# Patient Record
Sex: Female | Born: 1970 | Race: Black or African American | Hispanic: No | Marital: Single | Smoking: Current every day smoker
Health system: Southern US, Community
[De-identification: ages and names within clinical notes are randomized; demographics above are authoritative.]

## PROBLEM LIST (undated history)

## (undated) DIAGNOSIS — D259 Leiomyoma of uterus, unspecified: Secondary | ICD-10-CM

## (undated) DIAGNOSIS — IMO0002 Reserved for concepts with insufficient information to code with codable children: Secondary | ICD-10-CM

## (undated) DIAGNOSIS — D649 Anemia, unspecified: Secondary | ICD-10-CM

## (undated) DIAGNOSIS — J45909 Unspecified asthma, uncomplicated: Secondary | ICD-10-CM

## (undated) DIAGNOSIS — R87619 Unspecified abnormal cytological findings in specimens from cervix uteri: Secondary | ICD-10-CM

## (undated) HISTORY — PX: TUBAL LIGATION: SHX77

---

## 1997-12-10 ENCOUNTER — Emergency Department (HOSPITAL_COMMUNITY): Admission: EM | Admit: 1997-12-10 | Discharge: 1997-12-10 | Payer: Self-pay | Admitting: Emergency Medicine

## 1998-03-25 ENCOUNTER — Emergency Department (HOSPITAL_COMMUNITY): Admission: EM | Admit: 1998-03-25 | Discharge: 1998-03-25 | Payer: Self-pay | Admitting: Emergency Medicine

## 1998-10-15 ENCOUNTER — Emergency Department (HOSPITAL_COMMUNITY): Admission: EM | Admit: 1998-10-15 | Discharge: 1998-10-15 | Payer: Self-pay | Admitting: Emergency Medicine

## 1998-11-06 ENCOUNTER — Inpatient Hospital Stay (HOSPITAL_COMMUNITY): Admission: AD | Admit: 1998-11-06 | Discharge: 1998-11-06 | Payer: Self-pay | Admitting: *Deleted

## 1998-11-08 ENCOUNTER — Emergency Department (HOSPITAL_COMMUNITY): Admission: EM | Admit: 1998-11-08 | Discharge: 1998-11-08 | Payer: Self-pay | Admitting: *Deleted

## 1999-11-18 ENCOUNTER — Inpatient Hospital Stay (HOSPITAL_COMMUNITY): Admission: AD | Admit: 1999-11-18 | Discharge: 1999-11-18 | Payer: Self-pay | Admitting: Obstetrics

## 2000-04-03 ENCOUNTER — Emergency Department (HOSPITAL_COMMUNITY): Admission: EM | Admit: 2000-04-03 | Discharge: 2000-04-03 | Payer: Self-pay | Admitting: Emergency Medicine

## 2000-09-20 ENCOUNTER — Inpatient Hospital Stay (HOSPITAL_COMMUNITY): Admission: AD | Admit: 2000-09-20 | Discharge: 2000-09-20 | Payer: Self-pay | Admitting: *Deleted

## 2000-11-13 ENCOUNTER — Emergency Department (HOSPITAL_COMMUNITY): Admission: EM | Admit: 2000-11-13 | Discharge: 2000-11-13 | Payer: Self-pay | Admitting: Emergency Medicine

## 2001-02-06 ENCOUNTER — Emergency Department (HOSPITAL_COMMUNITY): Admission: EM | Admit: 2001-02-06 | Discharge: 2001-02-07 | Payer: Self-pay | Admitting: Emergency Medicine

## 2001-02-07 ENCOUNTER — Encounter: Payer: Self-pay | Admitting: Emergency Medicine

## 2001-04-21 ENCOUNTER — Emergency Department (HOSPITAL_COMMUNITY): Admission: EM | Admit: 2001-04-21 | Discharge: 2001-04-21 | Payer: Self-pay | Admitting: Emergency Medicine

## 2001-06-26 ENCOUNTER — Emergency Department (HOSPITAL_COMMUNITY): Admission: EM | Admit: 2001-06-26 | Discharge: 2001-06-27 | Payer: Self-pay | Admitting: Emergency Medicine

## 2001-11-16 ENCOUNTER — Other Ambulatory Visit: Admission: RE | Admit: 2001-11-16 | Discharge: 2001-11-16 | Payer: Self-pay | Admitting: Obstetrics and Gynecology

## 2002-11-27 ENCOUNTER — Other Ambulatory Visit: Admission: RE | Admit: 2002-11-27 | Discharge: 2002-11-27 | Payer: Self-pay | Admitting: Obstetrics and Gynecology

## 2004-03-05 ENCOUNTER — Emergency Department (HOSPITAL_COMMUNITY): Admission: EM | Admit: 2004-03-05 | Discharge: 2004-03-05 | Payer: Self-pay | Admitting: Emergency Medicine

## 2004-07-11 ENCOUNTER — Emergency Department (HOSPITAL_COMMUNITY): Admission: EM | Admit: 2004-07-11 | Discharge: 2004-07-11 | Payer: Self-pay | Admitting: Emergency Medicine

## 2004-11-17 ENCOUNTER — Emergency Department (HOSPITAL_COMMUNITY): Admission: EM | Admit: 2004-11-17 | Discharge: 2004-11-17 | Payer: Self-pay | Admitting: Emergency Medicine

## 2005-05-18 ENCOUNTER — Emergency Department (HOSPITAL_COMMUNITY): Admission: EM | Admit: 2005-05-18 | Discharge: 2005-05-18 | Payer: Self-pay | Admitting: Emergency Medicine

## 2006-03-20 ENCOUNTER — Emergency Department (HOSPITAL_COMMUNITY): Admission: EM | Admit: 2006-03-20 | Discharge: 2006-03-20 | Payer: Self-pay | Admitting: Emergency Medicine

## 2006-08-19 ENCOUNTER — Emergency Department (HOSPITAL_COMMUNITY): Admission: EM | Admit: 2006-08-19 | Discharge: 2006-08-19 | Payer: Self-pay | Admitting: Family Medicine

## 2008-02-20 DIAGNOSIS — R002 Palpitations: Secondary | ICD-10-CM | POA: Insufficient documentation

## 2008-02-20 DIAGNOSIS — R5381 Other malaise: Secondary | ICD-10-CM | POA: Insufficient documentation

## 2008-02-20 DIAGNOSIS — I5023 Acute on chronic systolic (congestive) heart failure: Secondary | ICD-10-CM | POA: Insufficient documentation

## 2008-02-20 DIAGNOSIS — R5383 Other fatigue: Secondary | ICD-10-CM

## 2008-02-20 DIAGNOSIS — R031 Nonspecific low blood-pressure reading: Secondary | ICD-10-CM

## 2008-02-20 DIAGNOSIS — R9389 Abnormal findings on diagnostic imaging of other specified body structures: Secondary | ICD-10-CM | POA: Insufficient documentation

## 2008-02-20 DIAGNOSIS — R209 Unspecified disturbances of skin sensation: Secondary | ICD-10-CM | POA: Insufficient documentation

## 2008-02-20 DIAGNOSIS — R0602 Shortness of breath: Secondary | ICD-10-CM | POA: Insufficient documentation

## 2008-02-20 DIAGNOSIS — R0989 Other specified symptoms and signs involving the circulatory and respiratory systems: Secondary | ICD-10-CM | POA: Insufficient documentation

## 2008-04-05 ENCOUNTER — Emergency Department (HOSPITAL_COMMUNITY): Admission: EM | Admit: 2008-04-05 | Discharge: 2008-04-05 | Payer: Self-pay | Admitting: Family Medicine

## 2008-04-06 ENCOUNTER — Emergency Department (HOSPITAL_COMMUNITY): Admission: EM | Admit: 2008-04-06 | Discharge: 2008-04-06 | Payer: Self-pay | Admitting: Family Medicine

## 2008-05-19 ENCOUNTER — Emergency Department (HOSPITAL_COMMUNITY): Admission: EM | Admit: 2008-05-19 | Discharge: 2008-05-19 | Payer: Self-pay | Admitting: Emergency Medicine

## 2008-07-17 ENCOUNTER — Emergency Department (HOSPITAL_COMMUNITY): Admission: EM | Admit: 2008-07-17 | Discharge: 2008-07-17 | Payer: Self-pay | Admitting: Family Medicine

## 2008-08-01 ENCOUNTER — Encounter (INDEPENDENT_AMBULATORY_CARE_PROVIDER_SITE_OTHER): Payer: Self-pay | Admitting: *Deleted

## 2008-08-01 DIAGNOSIS — F329 Major depressive disorder, single episode, unspecified: Secondary | ICD-10-CM

## 2008-08-08 ENCOUNTER — Ambulatory Visit: Payer: Self-pay | Admitting: Family Medicine

## 2008-08-13 ENCOUNTER — Encounter: Payer: Self-pay | Admitting: Family Medicine

## 2008-08-13 ENCOUNTER — Ambulatory Visit: Payer: Self-pay | Admitting: Family Medicine

## 2008-08-13 ENCOUNTER — Telehealth (INDEPENDENT_AMBULATORY_CARE_PROVIDER_SITE_OTHER): Payer: Self-pay | Admitting: *Deleted

## 2008-08-15 ENCOUNTER — Encounter: Admission: RE | Admit: 2008-08-15 | Discharge: 2008-08-15 | Payer: Self-pay | Admitting: Family Medicine

## 2008-08-15 ENCOUNTER — Ambulatory Visit: Payer: Self-pay | Admitting: Family Medicine

## 2008-08-22 ENCOUNTER — Ambulatory Visit: Payer: Self-pay | Admitting: Family Medicine

## 2008-08-28 ENCOUNTER — Telehealth: Payer: Self-pay | Admitting: Psychology

## 2008-09-02 ENCOUNTER — Encounter: Payer: Self-pay | Admitting: Family Medicine

## 2008-09-02 ENCOUNTER — Other Ambulatory Visit: Admission: RE | Admit: 2008-09-02 | Discharge: 2008-09-02 | Payer: Self-pay | Admitting: Family Medicine

## 2008-09-02 ENCOUNTER — Telehealth: Payer: Self-pay | Admitting: Family Medicine

## 2008-09-02 ENCOUNTER — Ambulatory Visit: Payer: Self-pay | Admitting: Family Medicine

## 2008-09-02 DIAGNOSIS — N898 Other specified noninflammatory disorders of vagina: Secondary | ICD-10-CM | POA: Insufficient documentation

## 2008-09-02 LAB — CONVERTED CEMR LAB
Blood in Urine, dipstick: NEGATIVE
Nitrite: NEGATIVE
Protein, U semiquant: 30
Urobilinogen, UA: 0.2

## 2008-09-04 LAB — CONVERTED CEMR LAB
Chlamydia, DNA Probe: NEGATIVE
GC Probe Amp, Genital: NEGATIVE

## 2008-09-27 ENCOUNTER — Telehealth: Payer: Self-pay | Admitting: Family Medicine

## 2008-10-01 ENCOUNTER — Telehealth: Payer: Self-pay | Admitting: *Deleted

## 2008-10-07 ENCOUNTER — Telehealth: Payer: Self-pay | Admitting: Family Medicine

## 2008-10-08 ENCOUNTER — Ambulatory Visit: Payer: Self-pay | Admitting: Family Medicine

## 2008-10-08 LAB — CONVERTED CEMR LAB: Whiff Test: POSITIVE

## 2008-10-10 ENCOUNTER — Telehealth (INDEPENDENT_AMBULATORY_CARE_PROVIDER_SITE_OTHER): Payer: Self-pay | Admitting: *Deleted

## 2008-11-01 ENCOUNTER — Ambulatory Visit: Payer: Self-pay | Admitting: Family Medicine

## 2009-03-05 ENCOUNTER — Ambulatory Visit: Payer: Self-pay | Admitting: Family Medicine

## 2009-03-05 DIAGNOSIS — E669 Obesity, unspecified: Secondary | ICD-10-CM

## 2009-03-12 ENCOUNTER — Ambulatory Visit: Payer: Self-pay | Admitting: Family Medicine

## 2009-03-14 ENCOUNTER — Ambulatory Visit: Payer: Self-pay | Admitting: Family Medicine

## 2009-03-14 DIAGNOSIS — J309 Allergic rhinitis, unspecified: Secondary | ICD-10-CM | POA: Insufficient documentation

## 2009-03-20 ENCOUNTER — Ambulatory Visit: Payer: Self-pay | Admitting: Family Medicine

## 2009-03-27 ENCOUNTER — Ambulatory Visit: Payer: Self-pay | Admitting: Sports Medicine

## 2009-03-27 DIAGNOSIS — M6740 Ganglion, unspecified site: Secondary | ICD-10-CM | POA: Insufficient documentation

## 2009-03-31 ENCOUNTER — Encounter (INDEPENDENT_AMBULATORY_CARE_PROVIDER_SITE_OTHER): Payer: Self-pay | Admitting: *Deleted

## 2009-03-31 DIAGNOSIS — F17200 Nicotine dependence, unspecified, uncomplicated: Secondary | ICD-10-CM | POA: Insufficient documentation

## 2009-09-30 ENCOUNTER — Emergency Department (HOSPITAL_COMMUNITY): Admission: EM | Admit: 2009-09-30 | Discharge: 2009-09-30 | Payer: Self-pay | Admitting: Family Medicine

## 2010-01-14 ENCOUNTER — Encounter: Payer: Self-pay | Admitting: Family Medicine

## 2010-03-31 ENCOUNTER — Encounter: Payer: Self-pay | Admitting: Family Medicine

## 2010-04-28 NOTE — Miscellaneous (Signed)
  Clinical Lists Changes  Problems: Removed problem of History of  ASTHMA, CHILDHOOD (ICD-493.00) 

## 2010-04-28 NOTE — Miscellaneous (Signed)
Summary: Tobacco Adele Milson  Clinical Lists Changes  Problems: Added new problem of TOBACCO Audery Wassenaar (ICD-305.1) 

## 2010-04-30 NOTE — Progress Notes (Signed)
Summary: ROI  ROI   Imported By: De Nurse 04/09/2010 16:36:59  _____________________________________________________________________  External Attachment:    Type:   Image     Comment:   External Document

## 2010-06-14 LAB — URINE CULTURE: Colony Count: >=100000

## 2010-06-14 LAB — WET PREP, GENITAL: Trich, Wet Prep: NONE SEEN

## 2010-06-14 LAB — GC/CHLAMYDIA PROBE AMP, GENITAL
Chlamydia, DNA Probe: NEGATIVE
GC Probe Amp, Genital: NEGATIVE

## 2010-06-14 LAB — POCT URINALYSIS DIP (DEVICE)
Ketones, ur: NEGATIVE mg/dL
Protein, ur: NEGATIVE mg/dL
Specific Gravity, Urine: 1.025 (ref 1.005–1.030)

## 2010-07-08 LAB — POCT URINALYSIS DIP (DEVICE)
Glucose, UA: NEGATIVE mg/dL
Specific Gravity, Urine: 1.025 (ref 1.005–1.030)
Urobilinogen, UA: 2 mg/dL — ABNORMAL HIGH (ref 0.0–1.0)

## 2010-07-08 LAB — URINE CULTURE: Colony Count: >=100000

## 2010-07-08 LAB — POCT PREGNANCY, URINE: Preg Test, Ur: NEGATIVE

## 2010-07-08 LAB — WET PREP, GENITAL: Yeast Wet Prep HPF POC: NONE SEEN

## 2010-07-14 LAB — URINALYSIS, ROUTINE W REFLEX MICROSCOPIC
Bilirubin Urine: NEGATIVE
Hgb urine dipstick: NEGATIVE
Ketones, ur: NEGATIVE mg/dL
Nitrite: NEGATIVE
Protein, ur: NEGATIVE mg/dL
Specific Gravity, Urine: 1.03 (ref 1.005–1.030)
Urobilinogen, UA: 1 mg/dL (ref 0.0–1.0)

## 2010-07-14 LAB — POCT I-STAT, CHEM 8
BUN: 14 mg/dL (ref 6–23)
Calcium, Ion: 1.19 mmol/L (ref 1.12–1.32)
Creatinine, Ser: 1 mg/dL (ref 0.4–1.2)
Hemoglobin: 14.6 g/dL (ref 12.0–15.0)
Sodium: 142 mEq/L (ref 135–145)
TCO2: 30 mmol/L (ref 0–100)

## 2010-07-14 LAB — CBC
HCT: 41.5 % (ref 36.0–46.0)
MCHC: 35 g/dL (ref 30.0–36.0)
MCV: 83.1 fL (ref 78.0–100.0)
Platelets: 198 10*3/uL (ref 150–400)

## 2010-07-14 LAB — DIFFERENTIAL
Basophils Relative: 1 % (ref 0–1)
Eosinophils Absolute: 0.2 10*3/uL (ref 0.0–0.7)
Eosinophils Relative: 3 % (ref 0–5)
Lymphs Abs: 2 10*3/uL (ref 0.7–4.0)
Neutrophils Relative %: 52 % (ref 43–77)

## 2010-07-14 LAB — POCT CARDIAC MARKERS
CKMB, poc: <1 ng/mL — ABNORMAL LOW (ref 1.0–8.0)
CKMB, poc: <1 ng/mL — ABNORMAL LOW (ref 1.0–8.0)
Troponin i, poc: <0.05 ng/mL (ref 0.00–0.09)

## 2010-07-14 LAB — D-DIMER, QUANTITATIVE: D-Dimer, Quant: 0.41 ug/mL-FEU (ref 0.00–0.48)

## 2011-01-09 ENCOUNTER — Inpatient Hospital Stay (INDEPENDENT_AMBULATORY_CARE_PROVIDER_SITE_OTHER)
Admission: RE | Admit: 2011-01-09 | Discharge: 2011-01-09 | Disposition: A | Discharge status: Home / Self Care | Payer: Self-pay | Source: Ambulatory Visit | Attending: Family Medicine | Admitting: Family Medicine

## 2011-01-09 DIAGNOSIS — J069 Acute upper respiratory infection, unspecified: Secondary | ICD-10-CM

## 2011-01-09 DIAGNOSIS — H6690 Otitis media, unspecified, unspecified ear: Secondary | ICD-10-CM

## 2011-09-11 ENCOUNTER — Encounter (HOSPITAL_COMMUNITY): Payer: Self-pay

## 2011-09-11 ENCOUNTER — Emergency Department (HOSPITAL_COMMUNITY)
Admission: EM | Admit: 2011-09-11 | Discharge: 2011-09-11 | Disposition: A | Discharge status: Home / Self Care | Payer: Medicaid Other | Attending: Emergency Medicine | Admitting: Emergency Medicine

## 2011-09-11 DIAGNOSIS — R51 Headache: Secondary | ICD-10-CM | POA: Insufficient documentation

## 2011-09-11 DIAGNOSIS — F17200 Nicotine dependence, unspecified, uncomplicated: Secondary | ICD-10-CM | POA: Insufficient documentation

## 2011-09-11 DIAGNOSIS — R110 Nausea: Secondary | ICD-10-CM | POA: Insufficient documentation

## 2011-09-11 MED ORDER — IBUPROFEN 800 MG PO TABS
800.0000 mg | ORAL_TABLET | Freq: Once | ORAL | Status: AC
  Administered 2011-09-11: 800 mg via ORAL
  Filled 2011-09-11: qty 1

## 2011-09-11 MED ORDER — CICLOPIROX 8 % EX SOLN: Freq: Every day | CUTANEOUS | Status: AC

## 2011-09-11 MED ORDER — ONDANSETRON HCL 4 MG PO TABS: 4.0000 mg | ORAL_TABLET | Freq: Four times a day (QID) | ORAL | Status: AC

## 2011-09-11 NOTE — ED Provider Notes (Signed)
Medical screening examination/treatment/procedure(s) were performed by non-physician practitioner and as supervising physician I was immediately available for consultation/collaboration.  Toy Baker, MD 09/11/11 [phone]

## 2011-09-11 NOTE — ED Notes (Signed)
Pt in from home with headache states onset this am states pain on left side of head states pain is pressure in sensation denies vision difficulties denies hx of migraines

## 2011-09-11 NOTE — Discharge Instructions (Signed)
Ms Stailey you have a headache today.  Drink plenty of water and take ibuprofen up to 800mg  every 6 hours with food as needed for pain.  No neurologic or stroke symptoms today.  Follow up with your pcp as needed.  Return for severe pain or uncontrolled nausea and vomiting.    General Headache, Without Cause A general headache has no specific cause. These headaches are not life-threatening. They will not lead to other types of headaches. HOME CARE   Make and keep follow-up visits with your doctor.   Only take medicine as told by your doctor.   Try to relax, get a massage, or use your thoughts to control your body (biofeedback).   Apply cold or heat to the head and neck. Apply 3 or 4 times a day or as needed.  Finding out the results of your test Ask when your test results will be ready. Make sure you get your test results. GET HELP RIGHT AWAY IF:   You have problems with medicine.   Your medicine does not help relieve pain.   Your headache changes or becomes worse.   You feel sick to your stomach (nauseous) or throw up (vomit).   You have a temperature by mouth above 102 F (38.9 C), not controlled by medicine.   Your have a stiff neck.   You have vision loss.   You have muscle weakness.   You lose control of your muscles.   You lose balance or have trouble walking.   You feel like you are going to pass out (faint).  MAKE SURE YOU:   Understand these instructions.   Will watch this condition.   Will get help right away if you are not doing well or get worse.  Document Released: 12/23/2007 Document Revised: 03/04/2011 Document Reviewed: 12/23/2007 [facility] Patient Information 2012 [place], [place].

## 2011-09-11 NOTE — ED Provider Notes (Signed)
History     CSN: [id number]  Arrival date & time 09/11/11  1520   First MD Initiated Contact with Patient 09/11/11 1549      Chief Complaint  Patient presents with  . Headache    (Consider location/radiation/quality/duration/timing/severity/associated sxs/prior treatment) Patient is a 41 y.o. female presenting with headaches. The history is provided by the patient. No language interpreter was used.  Headache  This is a new problem. The current episode started 6 to 12 hours ago. The problem occurs constantly. The problem has not changed since onset.The headache is associated with nothing. The pain is located in the left unilateral region. The quality of the pain is described as throbbing. The pain is at a severity of 5/10. The pain is mild. The pain does not radiate. Associated symptoms include nausea. Pertinent negatives include no fever, no chest pressure, no near-syncope, no palpitations, no shortness of breath and no vomiting. She has tried nothing for the symptoms.   41 year old female coming in with left unilateral headache behind her left eye since she woke this morning. States she feels like she has a hangover. States she was right fingers when she woke up that has resolved. States she was feeling nauseated earlier. Neuro intact. No stroke symptoms. No past medical history.   History reviewed. No pertinent past medical history.  History reviewed. No pertinent past surgical history.  No family history on file.  History  Substance Use Topics  . Smoking status: Current Everyday Smoker  . Smokeless tobacco: Not on file  . Alcohol Use: Yes    OB History    Grav Para Term Preterm Abortions TAB SAB Ect Mult Living                  Review of Systems  Constitutional: Negative.  Negative for fever.  Eyes: Negative.   Respiratory: Negative.  Negative for shortness of breath.   Cardiovascular: Negative.  Negative for palpitations and near-syncope.  Gastrointestinal: Positive  for nausea. Negative for vomiting.  Neurological: Positive for headaches. Negative for dizziness, facial asymmetry, speech difficulty, weakness and numbness.  Psychiatric/Behavioral: Negative.   All other systems reviewed and are negative.    Allergies  Review of patient's allergies indicates no known allergies.  Home Medications   Current Outpatient Rx  Name Route Sig Dispense Refill  . WOMENS DAILY FORMULA PO TABS Oral Take 1 tablet by mouth daily.      BP 107/77  Pulse 88  Temp 98.4 F (36.9 C) (Oral)  Resp 18  Wt 196 lb 3.4 oz (89 kg)  SpO2 97%  LMP 08/28/2011  Physical Exam  Nursing note and vitals reviewed. Constitutional: She is oriented to person, place, and time. She appears well-developed and well-nourished.  HENT:  Head: Normocephalic and atraumatic.  Eyes: Conjunctivae and EOM are normal. Pupils are equal, round, and reactive to light.  Neck: Normal range of motion. Neck supple.  Cardiovascular: Normal rate.   Pulmonary/Chest: Effort normal.  Abdominal: Soft.  Musculoskeletal: Normal range of motion. She exhibits no edema and no tenderness.  Neurological: She is alert and oriented to person, place, and time. She has normal reflexes. No cranial nerve deficit. Coordination normal.  Skin: Skin is warm and dry.  Psychiatric: She has a normal mood and affect.    ED Course  Procedures (including critical care time)  Labs Reviewed - No data to display No results found.   No diagnosis found.    MDM  Headache with awakening this am.  Ibuprofen with relief in ER.  Rx for zofran.  Return for neuro symptoms or severe pain and vomiting.         Remi Haggard, NP 09/11/11 1645

## 2012-02-07 ENCOUNTER — Emergency Department (HOSPITAL_COMMUNITY)
Admission: EM | Admit: 2012-02-07 | Discharge: 2012-02-07 | Disposition: A | Discharge status: Home / Self Care | Payer: Medicaid Other | Attending: Emergency Medicine | Admitting: Emergency Medicine

## 2012-02-07 ENCOUNTER — Encounter (HOSPITAL_COMMUNITY): Payer: Self-pay | Admitting: Emergency Medicine

## 2012-02-07 ENCOUNTER — Emergency Department (HOSPITAL_COMMUNITY): Payer: Medicaid Other

## 2012-02-07 DIAGNOSIS — R110 Nausea: Secondary | ICD-10-CM | POA: Insufficient documentation

## 2012-02-07 DIAGNOSIS — E860 Dehydration: Secondary | ICD-10-CM | POA: Insufficient documentation

## 2012-02-07 DIAGNOSIS — F17200 Nicotine dependence, unspecified, uncomplicated: Secondary | ICD-10-CM | POA: Insufficient documentation

## 2012-02-07 LAB — CBC
HCT: 38.1 % (ref 36.0–46.0)
Hemoglobin: 13.8 g/dL (ref 12.0–15.0)
MCH: 28.3 pg (ref 26.0–34.0)
MCHC: 36.2 g/dL — ABNORMAL HIGH (ref 30.0–36.0)
MCV: 78.2 fL (ref 78.0–100.0)

## 2012-02-07 LAB — URINALYSIS, ROUTINE W REFLEX MICROSCOPIC
Glucose, UA: NEGATIVE mg/dL
Leukocytes, UA: NEGATIVE
Nitrite: NEGATIVE
Protein, ur: NEGATIVE mg/dL
Urobilinogen, UA: 1 mg/dL (ref 0.0–1.0)

## 2012-02-07 LAB — BASIC METABOLIC PANEL
BUN: 14 mg/dL (ref 6–23)
Calcium: 8.7 mg/dL (ref 8.4–10.5)
Creatinine, Ser: 0.88 mg/dL (ref 0.50–1.10)
GFR calc non Af Amer: 82 mL/min — ABNORMAL LOW (ref >90)
Glucose, Bld: 132 mg/dL — ABNORMAL HIGH (ref 70–99)
Sodium: 140 mEq/L (ref 135–145)

## 2012-02-07 LAB — POCT I-STAT TROPONIN I: Troponin i, poc: 0 ng/mL (ref 0.00–0.08)

## 2012-02-07 MED ORDER — SODIUM CHLORIDE 0.9 % IV BOLUS (SEPSIS)
1000.0000 mL | Freq: Once | INTRAVENOUS | Status: AC
  Administered 2012-02-07: 1000 mL via INTRAVENOUS

## 2012-02-07 MED ORDER — SODIUM CHLORIDE 0.9 % IV SOLN
INTRAVENOUS | Status: DC
  Administered 2012-02-07: 13:00:00 via INTRAVENOUS

## 2012-02-07 NOTE — ED Notes (Signed)
Pt presenting to ed with c/o dizziness with positive nausea no vomiting. Pt states chest pain this morning but denies pain at this time. Pt denies shortness of breath at this time.

## 2012-02-07 NOTE — ED Notes (Signed)
Patient transported to CT 

## 2012-02-07 NOTE — ED Provider Notes (Signed)
History     CSN: [id number]  Arrival date & time 02/07/12  1040   First MD Initiated Contact with Patient 02/07/12 1118      Chief Complaint  Patient presents with  . Dizziness    (Consider location/radiation/quality/duration/timing/severity/associated sxs/prior treatment) The history is provided by the patient.   Patient here with dizziness that began yesterday. No associated headache or blurred vision. Symptoms began at rest and gradually resolved. She went to work today and she became lightheaded that was worse with walking. Denies any vomiting. Has had some nausea. Denies any dyspnea. Did have a 1-2 second episode of sharp chest pain abdominal pain which resolved spontaneously. No prior history of same. Patient denies any anginal type symptoms. She doesn't have any urinary symptoms. History reviewed. No pertinent past medical history.  Past Surgical History  Procedure Date  . Cesarean section   . Tubal ligation     No family history on file.  History  Substance Use Topics  . Smoking status: Current Every Day Smoker    Types: Cigarettes  . Smokeless tobacco: Not on file  . Alcohol Use: Yes     Comment: rarely    OB History    Grav Para Term Preterm Abortions TAB SAB Ect Mult Living                  Review of Systems  All other systems reviewed and are negative.    Allergies  Review of patient's allergies indicates no known allergies.  Home Medications   Current Outpatient Rx  Name  Route  Sig  Dispense  Refill  . BC HEADACHE POWDER PO   Oral   Take 1 packet by mouth as needed. Headache         . WOMENS DAILY FORMULA PO TABS   Oral   Take 1 tablet by mouth daily.           BP 119/73  Pulse 87  Temp 98.2 F (36.8 C) (Oral)  Resp 20  SpO2 96%  LMP 01/24/2012  Physical Exam  Nursing note and vitals reviewed. Constitutional: She is oriented to person, place, and time. She appears well-developed and well-nourished.  Non-toxic appearance. No  distress.  HENT:  Head: Normocephalic and atraumatic.  Eyes: Conjunctivae normal, EOM and lids are normal. Pupils are equal, round, and reactive to light.  Neck: Normal range of motion. Neck supple. No tracheal deviation present. No mass present.  Cardiovascular: Normal rate, regular rhythm and normal heart sounds.  Exam reveals no gallop.   No murmur heard. Pulmonary/Chest: Effort normal and breath sounds normal. No stridor. No respiratory distress. She has no decreased breath sounds. She has no wheezes. She has no rhonchi. She has no rales.  Abdominal: Soft. Normal appearance and bowel sounds are normal. She exhibits no distension. There is no tenderness. There is no rebound and no CVA tenderness.  Musculoskeletal: Normal range of motion. She exhibits no edema and no tenderness.  Neurological: She is alert and oriented to person, place, and time. She has normal strength. No cranial nerve deficit or sensory deficit. GCS eye subscore is 4. GCS verbal subscore is 5. GCS motor subscore is 6.  Skin: Skin is warm and dry. No abrasion and no rash noted.  Psychiatric: She has a normal mood and affect. Her speech is normal and behavior is normal.    ED Course  Procedures (including critical care time)   Labs Reviewed  CBC  BASIC METABOLIC PANEL  No results found.   No diagnosis found.    MDM  Pt given iv fluids for suspected hypotension --repeat bp improved, pt feels less dizzy, stable for d/c        Toy Baker, MD 02/07/12 1408

## 2012-02-15 ENCOUNTER — Encounter (HOSPITAL_COMMUNITY): Payer: Self-pay | Admitting: Emergency Medicine

## 2012-02-15 ENCOUNTER — Emergency Department (HOSPITAL_COMMUNITY)
Admission: EM | Admit: 2012-02-15 | Discharge: 2012-02-15 | Disposition: A | Discharge status: Home / Self Care | Payer: Medicaid Other | Attending: Emergency Medicine | Admitting: Emergency Medicine

## 2012-02-15 ENCOUNTER — Emergency Department (HOSPITAL_COMMUNITY): Payer: Medicaid Other

## 2012-02-15 DIAGNOSIS — F17200 Nicotine dependence, unspecified, uncomplicated: Secondary | ICD-10-CM | POA: Insufficient documentation

## 2012-02-15 DIAGNOSIS — R059 Cough, unspecified: Secondary | ICD-10-CM | POA: Insufficient documentation

## 2012-02-15 DIAGNOSIS — R05 Cough: Secondary | ICD-10-CM | POA: Insufficient documentation

## 2012-02-15 DIAGNOSIS — J069 Acute upper respiratory infection, unspecified: Secondary | ICD-10-CM | POA: Insufficient documentation

## 2012-02-15 DIAGNOSIS — J45909 Unspecified asthma, uncomplicated: Secondary | ICD-10-CM | POA: Insufficient documentation

## 2012-02-15 HISTORY — DX: Unspecified asthma, uncomplicated: J45.909

## 2012-02-15 MED ORDER — GUAIFENESIN ER 1200 MG PO TB12: 1.0000 | ORAL_TABLET | Freq: Two times a day (BID) | ORAL | Status: DC

## 2012-02-15 MED ORDER — PREDNISONE 50 MG PO TABS: 50.0000 mg | ORAL_TABLET | Freq: Every day | ORAL | Status: DC

## 2012-02-15 MED ORDER — PROMETHAZINE-DM 6.25-15 MG/5ML PO SYRP: 5.0000 mL | ORAL_SOLUTION | Freq: Four times a day (QID) | ORAL | Status: DC | PRN

## 2012-02-15 NOTE — ED Provider Notes (Signed)
History     CSN: [id number]  Arrival date & time 02/15/12  1543   First MD Initiated Contact with Patient 02/15/12 1635      Chief Complaint  Patient presents with  . Cough  . URI    (Consider location/radiation/quality/duration/timing/severity/associated sxs/prior treatment) HPI The patient presents to the emergency department with nasal congestion, cough, earache, and nasal drainage for the last 3 days.  Patient, states she was not getting any better, so she came to the emergency department.  Patient, states she tried a generic over-the-counter decongestant on Saturday night without any relief.  Patient denies chest pain, shortness of breath, nausea, vomiting, weakness, headache, blurred vision, fever, sore throat, abdominal pain, or diarrhea.  Patient, states that she lays down at night her cough is worse and her nasal congestion, keeps her from sleeping. Past Medical History  Diagnosis Date  . Asthma     Past Surgical History  Procedure Date  . Cesarean section   . Tubal ligation     No family history on file.  History  Substance Use Topics  . Smoking status: Current Every Day Smoker    Types: Cigarettes  . Smokeless tobacco: Not on file  . Alcohol Use: Yes     Comment: rarely    OB History    Grav Para Term Preterm Abortions TAB SAB Ect Mult Living                  Review of Systems All other systems negative except as documented in the HPI. All pertinent positives and negatives as reviewed in the HPI.  Allergies  Review of patient's allergies indicates no known allergies.  Home Medications   Current Outpatient Rx  Name  Route  Sig  Dispense  Refill  . BC HEADACHE POWDER PO   Oral   Take 1 packet by mouth daily as needed. Headache         . ADULT MULTIVITAMIN W/MINERALS CH   Oral   Take 1 tablet by mouth daily.           BP 95/61  Pulse 92  Temp 98.7 F (37.1 C) (Oral)  SpO2 100%  LMP 02/01/2012  Physical Exam  Nursing note and  vitals reviewed. Constitutional: She is oriented to person, place, and time. She appears well-developed and well-nourished.  HENT:  Head: Normocephalic and atraumatic.  Mouth/Throat: Oropharynx is clear and moist. No oropharyngeal exudate.  Eyes: Pupils are equal, round, and reactive to light.  Neck: Normal range of motion. Neck supple.  Cardiovascular: Normal rate, regular rhythm and normal heart sounds.  Exam reveals no gallop and no friction rub.   No murmur heard. Pulmonary/Chest: Effort normal and breath sounds normal. No respiratory distress. She has no wheezes.  Lymphadenopathy:    She has cervical adenopathy.  Neurological: She is alert and oriented to person, place, and time.  Skin: Skin is warm and dry.    ED Course  Procedures (including critical care time)     MDM  Patient, most likely has a viral URI, based on her history of present illness, and physical exam findings.  Patient will be treated as such and told to increase her fluid intake and rest as much.         Carlyle Dolly, PA-C 02/15/12 1743

## 2012-02-15 NOTE — ED Provider Notes (Signed)
Medical screening examination/treatment/procedure(s) were performed by non-physician practitioner and as supervising physician I was immediately available for consultation/collaboration.   Loren Racer, MD 02/15/12 2217

## 2012-02-15 NOTE — ED Notes (Signed)
Pt states that she has had a head cold with body aches, congestion, and thick green sputum since Saturday.

## 2012-06-08 ENCOUNTER — Encounter (HOSPITAL_COMMUNITY): Payer: Self-pay | Admitting: *Deleted

## 2012-06-08 ENCOUNTER — Inpatient Hospital Stay (HOSPITAL_COMMUNITY)
Admission: AD | Admit: 2012-06-08 | Discharge: 2012-06-08 | Disposition: A | Discharge status: Home / Self Care | Payer: Medicaid Other | Source: Ambulatory Visit | Attending: Obstetrics and Gynecology | Admitting: Obstetrics and Gynecology

## 2012-06-08 DIAGNOSIS — N949 Unspecified condition associated with female genital organs and menstrual cycle: Secondary | ICD-10-CM | POA: Insufficient documentation

## 2012-06-08 DIAGNOSIS — A499 Bacterial infection, unspecified: Secondary | ICD-10-CM | POA: Insufficient documentation

## 2012-06-08 DIAGNOSIS — B9689 Other specified bacterial agents as the cause of diseases classified elsewhere: Secondary | ICD-10-CM | POA: Insufficient documentation

## 2012-06-08 DIAGNOSIS — N760 Acute vaginitis: Secondary | ICD-10-CM | POA: Insufficient documentation

## 2012-06-08 DIAGNOSIS — B3731 Acute candidiasis of vulva and vagina: Secondary | ICD-10-CM | POA: Insufficient documentation

## 2012-06-08 DIAGNOSIS — L293 Anogenital pruritus, unspecified: Secondary | ICD-10-CM | POA: Insufficient documentation

## 2012-06-08 DIAGNOSIS — B373 Candidiasis of vulva and vagina: Secondary | ICD-10-CM | POA: Insufficient documentation

## 2012-06-08 HISTORY — DX: Anemia, unspecified: D64.9

## 2012-06-08 HISTORY — DX: Unspecified abnormal cytological findings in specimens from cervix uteri: R87.619

## 2012-06-08 HISTORY — DX: Leiomyoma of uterus, unspecified: D25.9

## 2012-06-08 HISTORY — DX: Reserved for concepts with insufficient information to code with codable children: IMO0002

## 2012-06-08 LAB — URINALYSIS, ROUTINE W REFLEX MICROSCOPIC
Glucose, UA: NEGATIVE mg/dL
Hgb urine dipstick: NEGATIVE
Ketones, ur: NEGATIVE mg/dL
Leukocytes, UA: NEGATIVE
Protein, ur: NEGATIVE mg/dL
pH: 7.5 (ref 5.0–8.0)

## 2012-06-08 LAB — WET PREP, GENITAL
Trich, Wet Prep: NONE SEEN
Yeast Wet Prep HPF POC: NONE SEEN

## 2012-06-08 MED ORDER — METRONIDAZOLE 500 MG PO TABS
500.0000 mg | ORAL_TABLET | Freq: Once | ORAL | Status: AC
  Administered 2012-06-08: 500 mg via ORAL
  Filled 2012-06-08: qty 1

## 2012-06-08 MED ORDER — TERCONAZOLE 0.4 % VA CREA: 1.0000 | TOPICAL_CREAM | Freq: Every day | VAGINAL | Status: DC

## 2012-06-08 NOTE — MAU Note (Signed)
No pain just irritated

## 2012-06-08 NOTE — MAU Note (Signed)
Vaginal burning and itching, very irritated, seems to be getting worse, past 2 wks.  Milky discharge.

## 2012-06-08 NOTE — MAU Provider Note (Signed)
CC: Vaginal Discharge    First Earsel Shouse Initiated Contact with Patient 06/08/12 1355      HPI Sarah Beltran is a 42 y.o. [id number] who presents with onset about 3 weeks ago of vaginal itching and large amount of white discharge the irrigation is gotten worse and now he has redness and itching of the vulva. She's gotten frequent yeast infections in the past. No new sex partner. LMP 05/22/12. Denies abnormal bleeding  Past Medical History  Diagnosis Date  . Asthma   . Abnormal Pap smear   . Fibroid, uterine   . Anemia     OB History   Grav Para Term Preterm Abortions TAB SAB Ect Mult Living   5 3 3  2  2   3      # Outc Date GA Lbr Len/2nd Wgt Sex Del Anes PTL Lv   1 SAB            2 SAB            3 TRM            4 TRM            5 TRM             No hx GDM but hx macrosomia 10#6  Past Surgical History  Procedure Laterality Date  . Cesarean section    . Tubal ligation      History   Social History  . Marital Status: Single    Spouse Name: N/A    Number of Children: N/A  . Years of Education: N/A   Occupational History  . Not on file.   Social History Main Topics  . Smoking status: Current Every Day Smoker -- 0.25 packs/day    Types: Cigarettes  . Smokeless tobacco: Not on file  . Alcohol Use: Yes     Comment: rarely  . Drug Use: No  . Sexually Active: Not on file   Other Topics Concern  . Not on file   Social History Narrative  . No narrative on file    No current facility-administered medications on file prior to encounter.   Current Outpatient Prescriptions on File Prior to Encounter  Medication Sig Dispense Refill  . Multiple Vitamin (MULTIVITAMIN WITH MINERALS) TABS Take 1 tablet by mouth daily.        Allergies  Allergen Reactions  . Peanut-Containing Drug Products Anaphylaxis    ROS Pertinent items in HPI  PHYSICAL EXAM Filed Vitals:   06/08/12 1329  BP: 100/62  Pulse: 79  Temp: 97.9 F (36.6 C)  Resp: 20   General: Well  nourished, well developed female in no acute distress Cardiovascular: Normal rate Respiratory: Normal effort Abdomen: Soft, nontender Back: No CVAT Extremities: No edema Neurologic: Alert and oriented Speculum exam: Faint pink rash of vulva, no lesions ; vagina with large amount thick white and creamy discharge, no blood; cervix clean Bimanual exam: cervix closed, no CMT; uterus NSSP; no adnexal tenderness or masses  LAB RESULTS Results for orders placed during the hospital encounter of 06/08/12 (from the past 24 hour(s))  URINALYSIS, ROUTINE W REFLEX MICROSCOPIC     Status: Abnormal   Collection Time    06/08/12  1:35 PM      Result Value Range   Color, Urine YELLOW  YELLOW   APPearance HAZY (*) CLEAR   Specific Gravity, Urine 1.015  1.005 - 1.030   pH 7.5  5.0 - 8.0   Glucose, UA NEGATIVE  NEGATIVE mg/dL   Hgb urine dipstick NEGATIVE  NEGATIVE   Bilirubin Urine NEGATIVE  NEGATIVE   Ketones, ur NEGATIVE  NEGATIVE mg/dL   Protein, ur NEGATIVE  NEGATIVE mg/dL   Urobilinogen, UA 0.2  0.0 - 1.0 mg/dL   Nitrite NEGATIVE  NEGATIVE   Leukocytes, UA NEGATIVE  NEGATIVE  POCT PREGNANCY, URINE     Status: None   Collection Time    06/08/12  2:04 PM      Result Value Range   Preg Test, Ur NEGATIVE  NEGATIVE  WET PREP, GENITAL     Status: Abnormal   Collection Time    06/08/12  3:00 PM      Result Value Range   Yeast Wet Prep HPF POC NONE SEEN  NONE SEEN   Trich, Wet Prep NONE SEEN  NONE SEEN   Clue Cells Wet Prep HPF POC MODERATE (*) NONE SEEN   WBC, Wet Prep HPF POC MODERATE (*) NONE SEEN  POCT PREGNANCY, URINE     Status: None   Collection Time    06/08/12  3:28 PM      Result Value Range   Preg Test, Ur NEGATIVE  NEGATIVE  GLUCOSE, CAPILLARY     Status: None   Collection Time    06/08/12  3:59 PM      Result Value Range   Glucose-Capillary 89  70 - 99 mg/dL        ASSESSMENT  1. BV (bacterial vaginosis)   2. Yeast infection involving the vagina and surrounding area    Clinical yeast infection  PLAN Discharge home. See AVS for patient education.    Medication List    TAKE these medications       ALOE VERA CONCENTRATE PO  Take 15 mLs by mouth daily with breakfast.     diphenhydramine-acetaminophen 25-500 MG Tabs  Commonly known as:  TYLENOL PM  Take 1 tablet by mouth at bedtime as needed (For pain, headache.).     FIBER FORMULA Caps  Take 4 capsules by mouth 2 (two) times daily.     multivitamin with minerals Tabs  Take 1 tablet by mouth daily.     terconazole 0.4 % vaginal cream  Commonly known as:  TERAZOL 7  Place 1 applicator vaginally at bedtime.     TOTAL BODY CLEANSE PO  Take 4 tablets by mouth at bedtime.       Follow-up Information   Schedule an appointment as soon as possible for a visit with [facility] [place]. (As needed)    Contact information:   [address] [place] [address] [phone]       Danae Orleans, CNM 06/08/2012 3:06 PM

## 2012-06-08 NOTE — MAU Note (Signed)
Name and DOB verified. Pt confirmed correct spelling on IDband

## 2012-06-09 LAB — GC/CHLAMYDIA PROBE AMP: GC Probe RNA: NEGATIVE

## 2012-06-09 NOTE — MAU Provider Note (Signed)
Attestation of Attending Supervision of Advanced Practitioner (CNM/NP): Evaluation and management procedures were performed by the Advanced Practitioner under my supervision and collaboration.  I have reviewed the Advanced Practitioner's note and chart, and I agree with the management and plan.  Jackqueline Aquilar 06/09/2012 12:23 AM

## 2014-01-28 ENCOUNTER — Encounter (HOSPITAL_COMMUNITY): Payer: Self-pay | Admitting: *Deleted

## 2014-12-19 ENCOUNTER — Emergency Department (INDEPENDENT_AMBULATORY_CARE_PROVIDER_SITE_OTHER)
Admission: EM | Admit: 2014-12-19 | Discharge: 2014-12-19 | Disposition: A | Discharge status: Home / Self Care | Payer: Self-pay | Source: Home / Self Care | Attending: Family Medicine | Admitting: Family Medicine

## 2014-12-19 ENCOUNTER — Encounter (HOSPITAL_COMMUNITY): Payer: Self-pay | Admitting: Emergency Medicine

## 2014-12-19 DIAGNOSIS — J029 Acute pharyngitis, unspecified: Secondary | ICD-10-CM

## 2014-12-19 LAB — POCT RAPID STREP A: Streptococcus, Group A Screen (Direct): NEGATIVE

## 2014-12-19 MED ORDER — AZITHROMYCIN 250 MG PO TABS: ORAL_TABLET | ORAL | Status: DC

## 2014-12-19 NOTE — ED Notes (Signed)
Pt has been suffering from a sore throat since yesterday.  Pt has not had a fever.  Pt states she feels like her throat is a little constricted and she has a lot of difficulty swallowing.

## 2014-12-19 NOTE — ED Provider Notes (Signed)
CSN: [id number]     Arrival date & time 12/19/14  1553 History   First MD Initiated Contact with Patient 12/19/14 1615     Chief Complaint  Patient presents with  . Sore Throat   (Consider location/radiation/quality/duration/timing/severity/associated sxs/prior Treatment) Patient is a 44 y.o. female presenting with pharyngitis. The history is provided by the patient.  Sore Throat This is a new problem. The current episode started 2 days ago. The problem has been gradually worsening. Pertinent negatives include no chest pain, no abdominal pain, no headaches and no shortness of breath. The symptoms are aggravated by swallowing.    Past Medical History  Diagnosis Date  . Asthma   . Abnormal Pap smear   . Fibroid, uterine   . Anemia    Past Surgical History  Procedure Laterality Date  . Cesarean section    . Tubal ligation     Family History  Problem Relation Age of Onset  . Hypertension Mother   . Diabetes Father   . Hypertension Father    Social History  Substance Use Topics  . Smoking status: Current Every Day Smoker -- 0.25 packs/day    Types: Cigarettes  . Smokeless tobacco: None  . Alcohol Use: Yes     Comment: rarely   OB History    Gravida Para Term Preterm AB TAB SAB Ectopic Multiple Living   5 3 3  2  2   3      Review of Systems  Constitutional: Positive for appetite change. Negative for fever.  HENT: Negative for postnasal drip and rhinorrhea.   Respiratory: Negative.  Negative for shortness of breath.   Cardiovascular: Negative.  Negative for chest pain.  Gastrointestinal: Negative for abdominal pain.  Neurological: Negative for headaches.  All other systems reviewed and are negative.   Allergies  Peanut-containing drug products  Home Medications   Prior to Admission medications   Medication Sig Start Date End Date Taking? Authorizing Provider  ALOE VERA CONCENTRATE PO Take 15 mLs by mouth daily with breakfast.    Historical Provider, MD   azithromycin (ZITHROMAX Z-PAK) 250 MG tablet Take as directed on pack 12/19/14   Billy Fischer, MD  diphenhydramine-acetaminophen (TYLENOL PM) 25-500 MG TABS Take 1 tablet by mouth at bedtime as needed (For pain, headache.).    Historical Provider, MD  FIBER FORMULA CAPS Take 4 capsules by mouth 2 (two) times daily.    Historical Provider, MD  Misc Natural Products (TOTAL BODY CLEANSE PO) Take 4 tablets by mouth at bedtime.    Historical Provider, MD  Multiple Vitamin (MULTIVITAMIN WITH MINERALS) TABS Take 1 tablet by mouth daily.    Historical Provider, MD  terconazole (TERAZOL 7) 0.4 % vaginal cream Place 1 applicator vaginally at bedtime. 06/08/12   Deirdre Freida Busman, CNM   Meds Ordered and Administered this Visit  Medications - No data to display  BP 110/58 mmHg  Pulse 87  Temp(Src) 97.9 F (36.6 C) (Oral)  Resp 16  SpO2 98%  LMP 12/12/2014 (Within Days) No data found.   Physical Exam  Constitutional: She is oriented to person, place, and time. She appears well-developed and well-nourished. No distress.  HENT:  Head: Normocephalic.  Right Ear: External ear normal.  Left Ear: External ear normal.  Mouth/Throat: Oropharynx is clear and moist.  Eyes: Conjunctivae are normal. Pupils are equal, round, and reactive to light.  Neck: Normal range of motion. Neck supple.  Lymphadenopathy:    She has no cervical adenopathy.  Neurological:  She is alert and oriented to person, place, and time.  Skin: Skin is warm and dry.  Nursing note and vitals reviewed.   ED Course  Procedures (including critical care time)  Labs Review Labs Reviewed  CULTURE, GROUP A STREP  POCT RAPID STREP A    Imaging Review No results found.   Visual Acuity Review  Right Eye Distance:   Left Eye Distance:   Bilateral Distance:    Right Eye Near:   Left Eye Near:    Bilateral Near:         MDM   1. Acute pharyngitis, unspecified pharyngitis type        Billy Fischer, MD 12/19/14  1946

## 2014-12-19 NOTE — Discharge Instructions (Signed)
Drink lots of fluids, take all of medicine, use lozenges as needed.return if needed °

## 2014-12-22 LAB — CULTURE, GROUP A STREP: Strep A Culture: NEGATIVE

## 2014-12-23 NOTE — ED Notes (Signed)
Final report of strep testing negative  

## 2015-07-25 ENCOUNTER — Ambulatory Visit (HOSPITAL_COMMUNITY)
Admission: EM | Admit: 2015-07-25 | Discharge: 2015-07-25 | Disposition: A | Discharge status: Home / Self Care | Payer: Self-pay | Attending: Internal Medicine | Admitting: Internal Medicine

## 2015-07-25 ENCOUNTER — Encounter (HOSPITAL_COMMUNITY): Payer: Self-pay | Admitting: Emergency Medicine

## 2015-07-25 ENCOUNTER — Ambulatory Visit (INDEPENDENT_AMBULATORY_CARE_PROVIDER_SITE_OTHER): Payer: Self-pay

## 2015-07-25 DIAGNOSIS — K5900 Constipation, unspecified: Secondary | ICD-10-CM

## 2015-07-25 DIAGNOSIS — M549 Dorsalgia, unspecified: Secondary | ICD-10-CM

## 2015-07-25 LAB — POCT URINALYSIS DIP (DEVICE)
Bilirubin Urine: NEGATIVE
GLUCOSE, UA: NEGATIVE mg/dL
Ketones, ur: NEGATIVE mg/dL
Leukocytes, UA: NEGATIVE
Nitrite: NEGATIVE
PH: 7.5 (ref 5.0–8.0)
PROTEIN: NEGATIVE mg/dL
Specific Gravity, Urine: 1.02 (ref 1.005–1.030)
UROBILINOGEN UA: 1 mg/dL (ref 0.0–1.0)

## 2015-07-25 MED ORDER — POLYETHYLENE GLYCOL 3350 17 G PO PACK: 17.0000 g | PACK | Freq: Every day | ORAL | Status: DC

## 2015-07-25 NOTE — ED Provider Notes (Signed)
CSN: [id number]     Arrival date & time 07/25/15  1357 History   First MD Initiated Contact with Patient 07/25/15 1529     Chief Complaint  Patient presents with  . Back Pain   HPI  45yo lady with 2 episodes brief/sharp lower abd pain, each resolved over a couple minutes and has happened in the last week.  Today some discomfort in lateral low back bilaterally.  Some bloating.  Back pain is different than previous episodes of back pain. No leg weakness/clumsiness.  Equivocal urinary frequency. Little bit of urinary hesitancy.  Worried about bladder or kidney issue.  No change in bowel/bladder control.  Tends to be constipated.  No fever.  Past Medical History  Diagnosis Date  . Asthma   . Abnormal Pap smear   . Fibroid, uterine   . Anemia    Past Surgical History  Procedure Laterality Date  . Cesarean section    . Tubal ligation     Family History  Problem Relation Age of Onset  . Hypertension Mother   . Diabetes Father   . Hypertension Father    Social History  Substance Use Topics  . Smoking status: Current Every Day Smoker -- 0.25 packs/day    Types: Cigarettes  . Smokeless tobacco: None  . Alcohol Use: Yes     Comment: rarely   OB History    Gravida Para Term Preterm AB TAB SAB Ectopic Multiple Living   5 3 3  2  2   3      Review of Systems  All other systems reviewed and are negative.   Allergies  Peanut-containing drug products  Home Medications   Prior to Admission medications   Medication Sig Start Date End Date Taking? Authorizing Provider  ALOE VERA CONCENTRATE PO Take 15 mLs by mouth daily with breakfast.    Historical Provider, MD  azithromycin (ZITHROMAX Z-PAK) 250 MG tablet Take as directed on pack 12/19/14   Billy Fischer, MD  diphenhydramine-acetaminophen (TYLENOL PM) 25-500 MG TABS Take 1 tablet by mouth at bedtime as needed (For pain, headache.).    Historical Provider, MD  FIBER FORMULA CAPS Take 4 capsules by mouth 2 (two) times daily.     Historical Provider, MD  Misc Natural Products (TOTAL BODY CLEANSE PO) Take 4 tablets by mouth at bedtime.    Historical Provider, MD  Multiple Vitamin (MULTIVITAMIN WITH MINERALS) TABS Take 1 tablet by mouth daily.    Historical Provider, MD  polyethylene glycol (MIRALAX) packet Take 17 g by mouth daily. 07/25/15   Sherlene Shams, MD  terconazole (TERAZOL 7) 0.4 % vaginal cream Place 1 applicator vaginally at bedtime. 06/08/12   Deirdre C Poe, CNM     BP 98/59 mmHg  Pulse 80  Temp(Src) 98.7 F (37.1 C) (Oral)  Resp 16  SpO2 100%  LMP 06/28/2015  Physical Exam  Constitutional: She is oriented to person, place, and time. No distress.  Alert, nicely groomed  HENT:  Head: Atraumatic.  Eyes:  Conjugate gaze, no eye redness/drainage  Neck: Neck supple.  Cardiovascular: Normal rate.   Pulmonary/Chest: No respiratory distress.  Lungs clear, symmetric breath sounds  Abdominal: Soft. She exhibits no distension. There is no tenderness. There is no rebound and no guarding.  No CVAT  Musculoskeletal: Normal range of motion.  No leg swelling  Neurological: She is alert and oriented to person, place, and time.  Skin: Skin is warm and dry.  No cyanosis  Nursing note and  vitals reviewed.   ED Course  Procedures (including critical care time)  Labs Review  Results for orders placed or performed during the hospital encounter of 07/25/15  POCT urinalysis dip (device)  Result Value Ref Range   Glucose, UA NEGATIVE NEGATIVE mg/dL   Bilirubin Urine NEGATIVE NEGATIVE   Ketones, ur NEGATIVE NEGATIVE mg/dL   Specific Gravity, Urine 1.020 1.005 - 1.030   Hgb urine dipstick TRACE (A) NEGATIVE   pH 7.5 5.0 - 8.0   Protein, ur NEGATIVE NEGATIVE mg/dL   Urobilinogen, UA 1.0 0.0 - 1.0 mg/dL   Nitrite NEGATIVE NEGATIVE   Leukocytes, UA NEGATIVE NEGATIVE    Imaging Review Dg Abd 2 Views  07/25/2015  CLINICAL DATA:  Abdominal and low back pain for 1 week. Bloating. Dysuria. EXAM: ABDOMEN - 2  VIEW COMPARISON:  None. FINDINGS: No evidence of dilated small bowel loops or air-fluid levels. No evidence of free intraperitoneal air. Moderate stool seen within the ascending, transverse and descending portions of the colon. Gas is noted within the rectum. Colon is nondilated. Multiple pelvic phleboliths are noted, but no definite radiopaque calculi are identified. IMPRESSION: No acute findings.  Moderate colonic stool burden noted. Electronically Signed   By: Earle Gell M.D.   On: 07/25/2015 16:50     MDM   1. Constipation, unspecified constipation type   2. Backache symptom    Meds ordered this encounter  Medications  . polyethylene glycol (MIRALAX) packet    Sig: Take 17 g by mouth daily.    Dispense:  30 each    Refill:  0   Recheck for further evaluation if symptoms not improving after using miralax for 1-2 weeks; to ED for increasing abd discomfort or new fever >100.5.    Sherlene Shams, MD 07/27/15 [id number]

## 2015-07-25 NOTE — ED Notes (Signed)
Patient can't void at this time 

## 2015-07-25 NOTE — ED Notes (Signed)
Lower back pain about a week ago

## 2015-07-25 NOTE — Discharge Instructions (Signed)
Recheck if abdominal bloating/discomfort are not starting to improve after taking miralax daily for 1-2 weeks.   To ER if marked increase in abdominal/back discomfort or for new fever >100.5.

## 2016-10-16 ENCOUNTER — Encounter (HOSPITAL_COMMUNITY): Payer: Self-pay

## 2016-10-16 ENCOUNTER — Ambulatory Visit (HOSPITAL_COMMUNITY)
Admission: EM | Admit: 2016-10-16 | Discharge: 2016-10-16 | Disposition: A | Discharge status: Other Acute Inpatient Hospital | Payer: Self-pay

## 2016-10-16 ENCOUNTER — Inpatient Hospital Stay (HOSPITAL_COMMUNITY)
Admission: AD | Admit: 2016-10-16 | Discharge: 2016-10-16 | Disposition: A | Discharge status: Home / Self Care | Payer: BLUE CROSS/BLUE SHIELD | Source: Ambulatory Visit | Attending: Obstetrics and Gynecology | Admitting: Obstetrics and Gynecology

## 2016-10-16 DIAGNOSIS — R109 Unspecified abdominal pain: Secondary | ICD-10-CM | POA: Diagnosis present

## 2016-10-16 DIAGNOSIS — R1011 Right upper quadrant pain: Secondary | ICD-10-CM | POA: Diagnosis not present

## 2016-10-16 DIAGNOSIS — Z202 Contact with and (suspected) exposure to infections with a predominantly sexual mode of transmission: Secondary | ICD-10-CM

## 2016-10-16 DIAGNOSIS — Z7689 Persons encountering health services in other specified circumstances: Secondary | ICD-10-CM

## 2016-10-16 DIAGNOSIS — F17210 Nicotine dependence, cigarettes, uncomplicated: Secondary | ICD-10-CM | POA: Insufficient documentation

## 2016-10-16 LAB — URINALYSIS, ROUTINE W REFLEX MICROSCOPIC
Bilirubin Urine: NEGATIVE
Glucose, UA: NEGATIVE mg/dL
Hgb urine dipstick: NEGATIVE
Ketones, ur: NEGATIVE mg/dL
Leukocytes, UA: NEGATIVE
NITRITE: NEGATIVE
PROTEIN: NEGATIVE mg/dL
Specific Gravity, Urine: 1.02 (ref 1.005–1.030)
pH: 5 (ref 5.0–8.0)

## 2016-10-16 LAB — COMPREHENSIVE METABOLIC PANEL
ALK PHOS: 43 U/L (ref 38–126)
ALT: 14 U/L (ref 14–54)
ANION GAP: 7 (ref 5–15)
AST: 19 U/L (ref 15–41)
Albumin: 3.7 g/dL (ref 3.5–5.0)
BUN: 11 mg/dL (ref 6–20)
CALCIUM: 8.7 mg/dL — AB (ref 8.9–10.3)
CO2: 25 mmol/L (ref 22–32)
Chloride: 105 mmol/L (ref 101–111)
Creatinine, Ser: 1.02 mg/dL — ABNORMAL HIGH (ref 0.44–1.00)
Glucose, Bld: 98 mg/dL (ref 65–99)
Potassium: 3.8 mmol/L (ref 3.5–5.1)
SODIUM: 137 mmol/L (ref 135–145)
TOTAL PROTEIN: 7.4 g/dL (ref 6.5–8.1)
Total Bilirubin: 0.8 mg/dL (ref 0.3–1.2)

## 2016-10-16 LAB — CBC WITH DIFFERENTIAL/PLATELET
Basophils Absolute: 0 10*3/uL (ref 0.0–0.1)
Basophils Relative: 1 %
EOS ABS: 0.1 10*3/uL (ref 0.0–0.7)
Eosinophils Relative: 1 %
HCT: 38.3 % (ref 36.0–46.0)
HEMOGLOBIN: 13.4 g/dL (ref 12.0–15.0)
LYMPHS ABS: 2.3 10*3/uL (ref 0.7–4.0)
Lymphocytes Relative: 44 %
MCH: 26.9 pg (ref 26.0–34.0)
MCHC: 35 g/dL (ref 30.0–36.0)
MCV: 76.8 fL — ABNORMAL LOW (ref 78.0–100.0)
MONOS PCT: 6 %
Monocytes Absolute: 0.3 10*3/uL (ref 0.1–1.0)
NEUTROS PCT: 48 %
Neutro Abs: 2.6 10*3/uL (ref 1.7–7.7)
Platelets: 252 10*3/uL (ref 150–400)
RBC: 4.99 MIL/uL (ref 3.87–5.11)
RDW: 16 % — ABNORMAL HIGH (ref 11.5–15.5)
WBC: 5.3 10*3/uL (ref 4.0–10.5)

## 2016-10-16 LAB — WET PREP, GENITAL
CLUE CELLS WET PREP: NONE SEEN
Sperm: NONE SEEN
TRICH WET PREP: NONE SEEN
Yeast Wet Prep HPF POC: NONE SEEN

## 2016-10-16 LAB — LIPASE, BLOOD: LIPASE: 29 U/L (ref 11–51)

## 2016-10-16 LAB — AMYLASE: AMYLASE: 62 U/L (ref 28–100)

## 2016-10-16 LAB — POCT PREGNANCY, URINE: PREG TEST UR: NEGATIVE

## 2016-10-16 MED ORDER — BUTALBITAL-APAP-CAFFEINE 50-325-40 MG PO TABS
2.0000 | ORAL_TABLET | Freq: Once | ORAL | Status: AC
  Administered 2016-10-16: 2 via ORAL
  Filled 2016-10-16: qty 2

## 2016-10-16 NOTE — Discharge Instructions (Signed)
Biliary Colic, Adult °Biliary colic is severe pain caused by a problem with a small organ in the upper right part of your belly (gallbladder). The gallbladder stores a digestive fluid produced in the liver (bile) that helps the body break down fat. Bile and other digestive enzymes are carried from the liver to the small intestine though tube-like structures (bile ducts). The gallbladder and the bile ducts form the biliary tract. °Sometimes hard deposits of digestive fluids form in the gallbladder (gallstones) and block the flow of bile from the gallbladder, causing biliary colic. This condition is also called a gallbladder attack. Gallstones can be as small as a grain of sand or as big as a golf ball. There could be just one gallstone in the gallbladder, or there could be many. °What are the causes? °Biliary colic is usually caused by gallstones. Less often, a tumor could block the flow of bile from the gallbladder and trigger biliary colic. °What increases the risk? °This condition is more likely to develop in: °· Women. °· People of Hispanic descent. °· People with a family history of gallstones. °· People who are obese. °· People who suddenly or quickly lose weight. °· People who eat a high-calorie, low-fiber diet that is rich in refined carbs (carbohydrates), such as white bread and white rice. °· People who have an intestinal disease that affects nutrient absorption, such as Crohn disease. °· People who have a metabolic condition, such as metabolic syndrome or diabetes. ° °What are the signs or symptoms? °Severe pain in the upper right side of the belly is the main symptom of biliary colic. You may feel this pain below the chest but above the hip. This pain often occurs at night or after eating a very fatty meal. This pain may get worse for up to an hour and last as long as 12 hours. In most cases, the pain fades (subsides) within a couple hours. °Other symptoms of this condition include: °· Nausea and  vomiting. °· Pain under the right shoulder. ° °How is this diagnosed? °This condition is diagnosed based on your medical history, your symptoms, and a physical exam. You may have tests, including: °· Blood tests to rule out infection or inflammation of the bile ducts, gallbladder, pancreas, or liver. °· Imaging studies such as: °? Ultrasound. °? CT scan. °? MRI. ° °In some cases, you may need to have an imaging study done using a small amount of radioactive material (nuclear medicine) to confirm the diagnosis. °How is this treated? °Treatment for this condition may include medicine to relieve your pain or nausea. If you have gallstones that are causing biliary colic, you may need surgery to remove the gallbladder (cholecystectomy). Gallstones can also be dissolved gradually with medicine. It may take months or years before the gallstones are completely gone. °Follow these instructions at home: °· Take over-the-counter and prescription medicines only as told by your health care provider. °· Drink enough fluid to keep your urine clear or pale yellow. °· Follow instructions from your health care provider about eating or drinking restrictions. These may include avoiding: °? Fatty, greasy, and fried foods. °? Any foods that make the pain worse. °? Overeating. °? Having a large meal after not eating for a while. °· Keep all follow-up visits as told by your health care provider. This is important. °How is this prevented? °Steps to prevent this condition include: °· Maintaining a healthy body weight. °· Getting regular exercise. °· Eating a healthy, high-fiber, low-fat diet. °· Limiting   how much sugar and refined carbs you eat, such as sweets, white flour, and white rice. ° °Contact a health care provider if: °· Your pain lasts more than 5 hours. °· You vomit. °· You have a fever and chills. °· Your pain gets worse. °Get help right away if: °· Your skin or the whites of your eyes look yellow (jaundice). °· Your have  tea-colored urine and light-colored stools. °· You are dizzy or you faint. °This information is not intended to replace advice given to you by your health care provider. Make sure you discuss any questions you have with your health care provider. °Document Released: 08/16/2005 Document Revised: 11/11/2015 Document Reviewed: 09/29/2015 °Elsevier Interactive Patient Education © 2018 [employer] ° °

## 2016-10-16 NOTE — MAU Note (Signed)
Patient states having lower back pain since Sunday the July 15th after having sex.  Headache started yesterday. No appetite for 3 weeks.

## 2016-10-16 NOTE — MAU Provider Note (Signed)
History   46 yo BF in with abd pain, malaise, states sex several days ago and afraid that partner left a condom up in her. States this is first sexual encounter in two years and is concerned.  CSN: [id number]  Arrival date & time 10/16/16  1310   First Provider Initiated Contact with Patient 10/16/16 1405      Chief Complaint  Patient presents with  . Back Pain    lower  . Headache    HPI  Past Medical History:  Diagnosis Date  . Abnormal Pap smear   . Anemia   . Asthma   . Fibroid, uterine     Past Surgical History:  Procedure Laterality Date  . CESAREAN SECTION    . TUBAL LIGATION      Family History  Problem Relation Age of Onset  . Hypertension Mother   . Diabetes Father   . Hypertension Father     Social History  Substance Use Topics  . Smoking status: Current Every Day Smoker    Packs/day: 0.25    Types: Cigarettes  . Smokeless tobacco: Not on file  . Alcohol use Yes     Comment: rarely    OB History    Gravida Para Term Preterm AB Living   5 3 3   2 3    SAB TAB Ectopic Multiple Live Births   2              Review of Systems  Constitutional: Positive for fatigue.  HENT: Negative.   Eyes: Negative.   Respiratory: Negative.   Cardiovascular: Negative.   Gastrointestinal: Positive for abdominal pain.  Endocrine: Negative.   Genitourinary: Negative.   Musculoskeletal: Positive for back pain.  Skin: Negative.   Allergic/Immunologic: Negative.   Neurological: Negative.   Hematological: Negative.   Psychiatric/Behavioral: Negative.     Allergies  Peanut-containing drug products  Home Medications    BP 109/70 (BP Location: Left Arm)   Pulse 93   Temp 98.2 F (36.8 C) (Oral)   Resp 16   LMP 09/15/2016 (Approximate)   Physical Exam  Constitutional: She is oriented to person, place, and time. She appears well-developed and well-nourished.  HENT:  Head: Normocephalic.  Eyes: Pupils are equal, round, and reactive to light.  Neck:  Normal range of motion.  Cardiovascular: Normal rate, regular rhythm, normal heart sounds and intact distal pulses.   Pulmonary/Chest: Effort normal and breath sounds normal.  Abdominal: Soft. Bowel sounds are normal.  Genitourinary: Vagina normal and uterus normal.  Musculoskeletal: Normal range of motion.  Neurological: She is alert and oriented to person, place, and time. She has normal reflexes.  Skin: Skin is warm and dry.  Psychiatric: She has a normal mood and affect. Her behavior is normal. Judgment and thought content normal.    MAU Course  Procedures (including critical care time)  Labs Reviewed  WET PREP, GENITAL  URINALYSIS, ROUTINE W REFLEX MICROSCOPIC  RPR  CBC WITH DIFFERENTIAL/PLATELET  COMPREHENSIVE METABOLIC PANEL  LIPASE, BLOOD  AMYLASE  HIV ANTIBODY (ROUTINE TESTING)  POCT PREGNANCY, URINE  GC/CHLAMYDIA PROBE AMP ([place]) NOT AT [facility]   No results found.   No diagnosis found.    MDM  Sterile spec exam done, no foreign body noted. Wet prep, gc and chla done. Right upper quad tenderness with exam. Lower abd non tender. VSS. Pt labs all WNL will d/c home.

## 2016-10-18 LAB — HIV ANTIBODY (ROUTINE TESTING W REFLEX): HIV SCREEN 4TH GENERATION: NONREACTIVE

## 2016-10-18 LAB — GC/CHLAMYDIA PROBE AMP (~~LOC~~) NOT AT ARMC
Chlamydia: NEGATIVE
Neisseria Gonorrhea: NEGATIVE

## 2016-10-18 LAB — RPR: RPR: NONREACTIVE

## 2017-07-12 ENCOUNTER — Ambulatory Visit: Payer: Self-pay | Admitting: Gynecology

## 2017-07-12 DIAGNOSIS — Z0289 Encounter for other administrative examinations: Secondary | ICD-10-CM

## 2017-10-26 ENCOUNTER — Ambulatory Visit: Payer: Self-pay | Admitting: Gynecology

## 2017-10-26 DIAGNOSIS — Z0289 Encounter for other administrative examinations: Secondary | ICD-10-CM

## 2017-12-28 ENCOUNTER — Encounter (HOSPITAL_COMMUNITY): Payer: Self-pay | Admitting: *Deleted

## 2017-12-28 ENCOUNTER — Other Ambulatory Visit: Payer: Self-pay

## 2017-12-28 ENCOUNTER — Inpatient Hospital Stay (HOSPITAL_COMMUNITY)
Admission: AD | Admit: 2017-12-28 | Discharge: 2017-12-28 | Discharge status: Against Medical Advice | Payer: Commercial Managed Care - PPO | Source: Ambulatory Visit | Attending: Family Medicine | Admitting: Family Medicine

## 2017-12-28 DIAGNOSIS — R102 Pelvic and perineal pain: Secondary | ICD-10-CM | POA: Diagnosis not present

## 2017-12-28 DIAGNOSIS — R109 Unspecified abdominal pain: Secondary | ICD-10-CM | POA: Diagnosis present

## 2017-12-28 DIAGNOSIS — F17210 Nicotine dependence, cigarettes, uncomplicated: Secondary | ICD-10-CM | POA: Diagnosis not present

## 2017-12-28 LAB — URINALYSIS, ROUTINE W REFLEX MICROSCOPIC
Bilirubin Urine: NEGATIVE
Glucose, UA: NEGATIVE mg/dL
Hgb urine dipstick: NEGATIVE
KETONES UR: NEGATIVE mg/dL
LEUKOCYTES UA: NEGATIVE
NITRITE: NEGATIVE
PROTEIN: NEGATIVE mg/dL
Specific Gravity, Urine: 1.019 (ref 1.005–1.030)
pH: 6 (ref 5.0–8.0)

## 2017-12-28 LAB — WET PREP, GENITAL
CLUE CELLS WET PREP: NONE SEEN
Sperm: NONE SEEN
Trich, Wet Prep: NONE SEEN
Yeast Wet Prep HPF POC: NONE SEEN

## 2017-12-28 LAB — POCT PREGNANCY, URINE: Preg Test, Ur: NEGATIVE

## 2017-12-28 MED ORDER — IBUPROFEN 800 MG PO TABS
800.0000 mg | ORAL_TABLET | Freq: Once | ORAL | Status: AC
  Administered 2017-12-28: 800 mg via ORAL
  Filled 2017-12-28: qty 1

## 2017-12-28 NOTE — Progress Notes (Signed)
Pt left AMA, states she has to leave for work.

## 2017-12-28 NOTE — MAU Note (Signed)
Pt presents with symptoms of UTI...urinary frequency, dysuria, urgency & foul smelling urine. Pt also desires STI testing.

## 2017-12-28 NOTE — MAU Provider Note (Signed)
History     CSN: [id number]  Arrival date and time: 12/28/17 1139   First Provider Initiated Contact with Patient 12/28/17 1238      Chief Complaint  Patient presents with  . Abdominal Pain   HPI: Sarah Beltran is a 47 y.o female presenting with a chief complaint of abdominal pain for the past two weeks. She describes the pain as "crampy". She reports that it "comes and goes" and cannot think of any aggravating or alleviating factors. She also reports urinary frequency, urgency and foul smelling urine. She reports her LMP was 12/12/17. She is currently sexually active with one partner, denies use of protection. States "I had my tubes tied". She reports white vaginal discharge and requests STI testing. Denies hematuria, nausea, vomiting, and changes in bowel habits.   OB History    Gravida  5   Para  3   Term  3   Preterm      AB  2   Living  3     SAB  2   TAB      Ectopic      Multiple      Live Births  3           Past Medical History:  Diagnosis Date  . Abnormal Pap smear   . Anemia   . Asthma   . Fibroid, uterine     Past Surgical History:  Procedure Laterality Date  . CESAREAN SECTION    . TUBAL LIGATION      Family History  Problem Relation Age of Onset  . Hypertension Mother   . Thyroid disease Mother   . Diabetes Father   . Hypertension Father   . Thyroid disease Sister     Social History   Tobacco Use  . Smoking status: Current Every Day Smoker    Packs/day: 0.00    Types: E-cigarettes  . Smokeless tobacco: Never Used  Substance Use Topics  . Alcohol use: Yes    Comment: rarely  . Drug use: Not Currently    Types: Marijuana    Allergies:  Allergies  Allergen Reactions  . Peanut-Containing Drug Products Anaphylaxis  . Peanut Oil Swelling    Lip swelling.    No medications prior to admission.    Review of Systems  Gastrointestinal: Positive for abdominal pain (lower abdominal pain). Negative for constipation,  diarrhea, nausea and vomiting.  Genitourinary: Positive for dysuria, frequency, urgency and vaginal discharge (occasional, white). Negative for hematuria.   Physical Exam   Blood pressure 107/62, pulse 90, temperature 98.4 F (36.9 C), temperature source Oral, resp. rate 18, height 5\' 4"  (1.626 m), weight 76.2 kg, last menstrual period 11/28/2017, SpO2 99 %.  Physical Exam  Cardiovascular: Normal rate, regular rhythm and normal heart sounds.  Respiratory: Effort normal and breath sounds normal. She has no wheezes.  GI: Soft. Bowel sounds are normal. There is no tenderness. There is no guarding.   Results for orders placed or performed during the hospital encounter of 12/28/17 (from the past 24 hour(s))  Urinalysis, Routine w reflex microscopic     Status: Abnormal   Collection Time: 12/28/17 12:23 PM  Result Value Ref Range   Color, Urine YELLOW YELLOW   APPearance HAZY (A) CLEAR   Specific Gravity, Urine 1.019 1.005 - 1.030   pH 6.0 5.0 - 8.0   Glucose, UA NEGATIVE NEGATIVE mg/dL   Hgb urine dipstick NEGATIVE NEGATIVE   Bilirubin Urine NEGATIVE NEGATIVE   Ketones, ur NEGATIVE  NEGATIVE mg/dL   Protein, ur NEGATIVE NEGATIVE mg/dL   Nitrite NEGATIVE NEGATIVE   Leukocytes, UA NEGATIVE NEGATIVE  Pregnancy, urine POC     Status: None   Collection Time: 12/28/17 12:35 PM  Result Value Ref Range   Preg Test, Ur NEGATIVE NEGATIVE  Wet prep, genital     Status: Abnormal   Collection Time: 12/28/17  1:18 PM  Result Value Ref Range   Yeast Wet Prep HPF POC NONE SEEN NONE SEEN   Trich, Wet Prep NONE SEEN NONE SEEN   Clue Cells Wet Prep HPF POC NONE SEEN NONE SEEN   WBC, Wet Prep HPF POC FEW (A) NONE SEEN   Sperm NONE SEEN    MAU Course  Procedures  MDM UA without nitrates, leukocytes, or Hgb make UTI unlikely.  GC/Chlamydia Wet prep results reviewed.   Assessment and Plan  Pelvic pain in female - Plan: Discharge patient -Patient was discharged to home. She will be notified with  any abnormal results from STI testing.   [name] 12/28/2017, 3:35 PM

## 2017-12-28 NOTE — MAU Note (Signed)
Pt presents to MAU with complaints of lower abdominal pain for two weeks. White thick vaginal discharge and wants to be checked for STDs.

## 2017-12-28 NOTE — MAU Provider Note (Addendum)
History     CSN: [id number]  Arrival date and time: 12/28/17 1139   First Provider Initiated Contact with Patient 12/28/17 1238      Chief Complaint  Patient presents with  . Abdominal Pain   Sarah Beltran is a 47 y.o. [id number] who is here today with lower abdominal pain x 2 weeks.   Pelvic Pain  The patient's primary symptoms include pelvic pain and vaginal discharge. This is a new problem. Episode onset: 2 weeks. The problem occurs constantly. The problem has been gradually worsening. Pain severity now: 4/10. The problem affects both sides. She is not pregnant. Associated symptoms include dysuria and frequency. Pertinent negatives include no chills, fever, nausea or vomiting. The vaginal discharge was white and thick. Nothing aggravates the symptoms. She has tried nothing for the symptoms. She is sexually active. She uses tubal ligation for contraception. Her menstrual history has been regular (LMP 12/15/17 ).    OB History    Gravida  5   Para  3   Term  3   Preterm      AB  2   Living  3     SAB  2   TAB      Ectopic      Multiple      Live Births  3           Past Medical History:  Diagnosis Date  . Abnormal Pap smear   . Anemia   . Asthma   . Fibroid, uterine     Past Surgical History:  Procedure Laterality Date  . CESAREAN SECTION    . TUBAL LIGATION      Family History  Problem Relation Age of Onset  . Hypertension Mother   . Thyroid disease Mother   . Diabetes Father   . Hypertension Father   . Thyroid disease Sister     Social History   Tobacco Use  . Smoking status: Current Every Day Smoker    Packs/day: 0.00    Types: E-cigarettes  . Smokeless tobacco: Never Used  Substance Use Topics  . Alcohol use: Yes    Comment: rarely  . Drug use: Not Currently    Types: Marijuana    Allergies:  Allergies  Allergen Reactions  . Peanut-Containing Drug Products Anaphylaxis  . Peanut Oil Swelling    Lip swelling.     Medications Prior to Admission  Medication Sig Dispense Refill Last Dose  . albuterol (PROVENTIL HFA;VENTOLIN HFA) 108 (90 Base) MCG/ACT inhaler Inhale 2 puffs into the lungs every 6 (six) hours as needed for shortness of breath.    rescue  . EPINEPHrine 0.3 mg/0.3 mL IJ SOAJ injection Inject 0.3 mg into the muscle once.      . ferrous sulfate 325 (65 FE) MG tablet Take by mouth.   10/15/2016 at Unknown time  . fluticasone (FLONASE) 50 MCG/ACT nasal spray Place 1 spray into both nostrils daily as needed for allergies or rhinitis.    Past Month at Unknown time  . ibuprofen (ADVIL,MOTRIN) 200 MG tablet Take 400 mg by mouth every 6 (six) hours as needed for mild pain or cramping.   10/16/2016 at Unknown time    Review of Systems  Constitutional: Negative for chills and fever.  Gastrointestinal: Negative for nausea and vomiting.  Genitourinary: Positive for dysuria, frequency, pelvic pain and vaginal discharge. Negative for vaginal bleeding.   Physical Exam   Blood pressure 107/62, pulse 90, temperature 98.4 F (36.9 C), temperature source  Oral, resp. rate 18, height 5\' 4"  (1.626 m), last menstrual period 11/28/2017, SpO2 99 %.  Physical Exam  Nursing note and vitals reviewed. Constitutional: She is oriented to person, place, and time. She appears well-developed.  HENT:  Head: Normocephalic.  Cardiovascular: Normal rate.  Respiratory: Effort normal.  GI: Soft. There is no tenderness.  Neurological: She is alert and oriented to person, place, and time.  Skin: Skin is warm and dry.  Psychiatric: She has a normal mood and affect.   Results for orders placed or performed during the hospital encounter of 12/28/17 (from the past 24 hour(s))  Urinalysis, Routine w reflex microscopic     Status: Abnormal   Collection Time: 12/28/17 12:23 PM  Result Value Ref Range   Color, Urine YELLOW YELLOW   APPearance HAZY (A) CLEAR   Specific Gravity, Urine 1.019 1.005 - 1.030   pH 6.0 5.0 - 8.0    Glucose, UA NEGATIVE NEGATIVE mg/dL   Hgb urine dipstick NEGATIVE NEGATIVE   Bilirubin Urine NEGATIVE NEGATIVE   Ketones, ur NEGATIVE NEGATIVE mg/dL   Protein, ur NEGATIVE NEGATIVE mg/dL   Nitrite NEGATIVE NEGATIVE   Leukocytes, UA NEGATIVE NEGATIVE  Pregnancy, urine POC     Status: None   Collection Time: 12/28/17 12:35 PM  Result Value Ref Range   Preg Test, Ur NEGATIVE NEGATIVE  Wet prep, genital     Status: Abnormal   Collection Time: 12/28/17  1:18 PM  Result Value Ref Range   Yeast Wet Prep HPF POC NONE SEEN NONE SEEN   Trich, Wet Prep NONE SEEN NONE SEEN   Clue Cells Wet Prep HPF POC NONE SEEN NONE SEEN   WBC, Wet Prep HPF POC FEW (A) NONE SEEN   Sperm NONE SEEN     MAU Course  Procedures  MDM Patient left AMA prior to me being able to review results of UA/wet prep with her. Per RN she signed the AMA form.   Assessment and Plan   1. Pelvic pain in female    AMA GC/CT pending Urine culture pending  RX: none  Return to MAU as needed FU with OB as planned  Follow-up Information    Department, [facility] Follow up.   Contact information: [place] [id number] [phone]            Marcille Buffy 12/28/2017, 2:02 PM

## 2017-12-28 NOTE — Discharge Instructions (Signed)
Pelvic Pain, Female °Pelvic pain is pain in your lower belly (abdomen), below your belly button and between your hips. The pain may start suddenly (acute), keep coming back (recurring), or last a long time (chronic). Pelvic pain that lasts longer than six months is considered chronic. There are many causes of pelvic pain. Sometimes the cause of your pelvic pain is not known. °Follow these instructions at home: °· Take over-the-counter and prescription medicines only as told by your doctor. °· Rest as told by your doctor. °· Do not have sex it if hurts. °· Keep a journal of your pelvic pain. Write down: °? When the pain started. °? Where the pain is located. °? What seems to make the pain better or worse, such as food or your menstrual cycle. °? Any symptoms you have along with the pain. °· Keep all follow-up visits as told by your doctor. This is important. °Contact a doctor if: °· Medicine does not help your pain. °· Your pain comes back. °· You have new symptoms. °· You have unusual vaginal discharge or bleeding. °· You have a fever or chills. °· You are having a hard time pooping (constipation). °· You have blood in your pee (urine) or poop (stool). °· Your pee smells bad. °· You feel weak or lightheaded. °Get help right away if: °· You have sudden pain that is very bad. °· Your pain continues to get worse. °· You have very bad pain and also have any of the following symptoms: °? A fever. °? Feeling stick to your stomach (nausea). °? Throwing up (vomiting). °? Being very sweaty. °· You pass out (lose consciousness). °This information is not intended to replace advice given to you by your health care provider. Make sure you discuss any questions you have with your health care provider. °Document Released: 09/01/2007 Document Revised: 04/09/2015 Document Reviewed: 01/03/2015 °Elsevier Interactive Patient Education © 2018 [employer] ° °

## 2017-12-28 NOTE — Progress Notes (Signed)
Vaginal swabs done & sent to lab.

## 2017-12-29 LAB — GC/CHLAMYDIA PROBE AMP (~~LOC~~) NOT AT ARMC
CHLAMYDIA, DNA PROBE: NEGATIVE
NEISSERIA GONORRHEA: NEGATIVE

## 2017-12-30 LAB — URINE CULTURE: Culture: >=100000 — AB

## 2017-12-31 ENCOUNTER — Other Ambulatory Visit: Payer: Self-pay | Admitting: Advanced Practice Midwife

## 2017-12-31 MED ORDER — SULFAMETHOXAZOLE-TRIMETHOPRIM 800-160 MG PO TABS: 1.0000 | ORAL_TABLET | Freq: Two times a day (BID) | ORAL | 0 refills | Status: AC

## 2018-01-07 ENCOUNTER — Other Ambulatory Visit: Payer: Self-pay | Admitting: Advanced Practice Midwife

## 2018-01-07 MED ORDER — SULFAMETHOXAZOLE-TRIMETHOPRIM 800-160 MG PO TABS: 1.0000 | ORAL_TABLET | Freq: Two times a day (BID) | ORAL | 0 refills | Status: DC

## 2018-01-07 NOTE — Progress Notes (Signed)
Patient called the unit, and asked that the rx that was sent in for a UTI be sent to a different pharmacy. RX for bactrim BID x 3 days sent to [facility] on [address] per patient request.   Marcille Buffy 12:58 PM 01/07/18

## 2018-02-16 IMAGING — DX DG ABDOMEN 2V
2 series · 2 of 2 positions shown · non-contrast
Comparison: None.

CLINICAL DATA: Abdominal and low back pain for 1 week. Bloating.
Dysuria.

EXAM:
ABDOMEN - 2 VIEW

[abdomen erect]
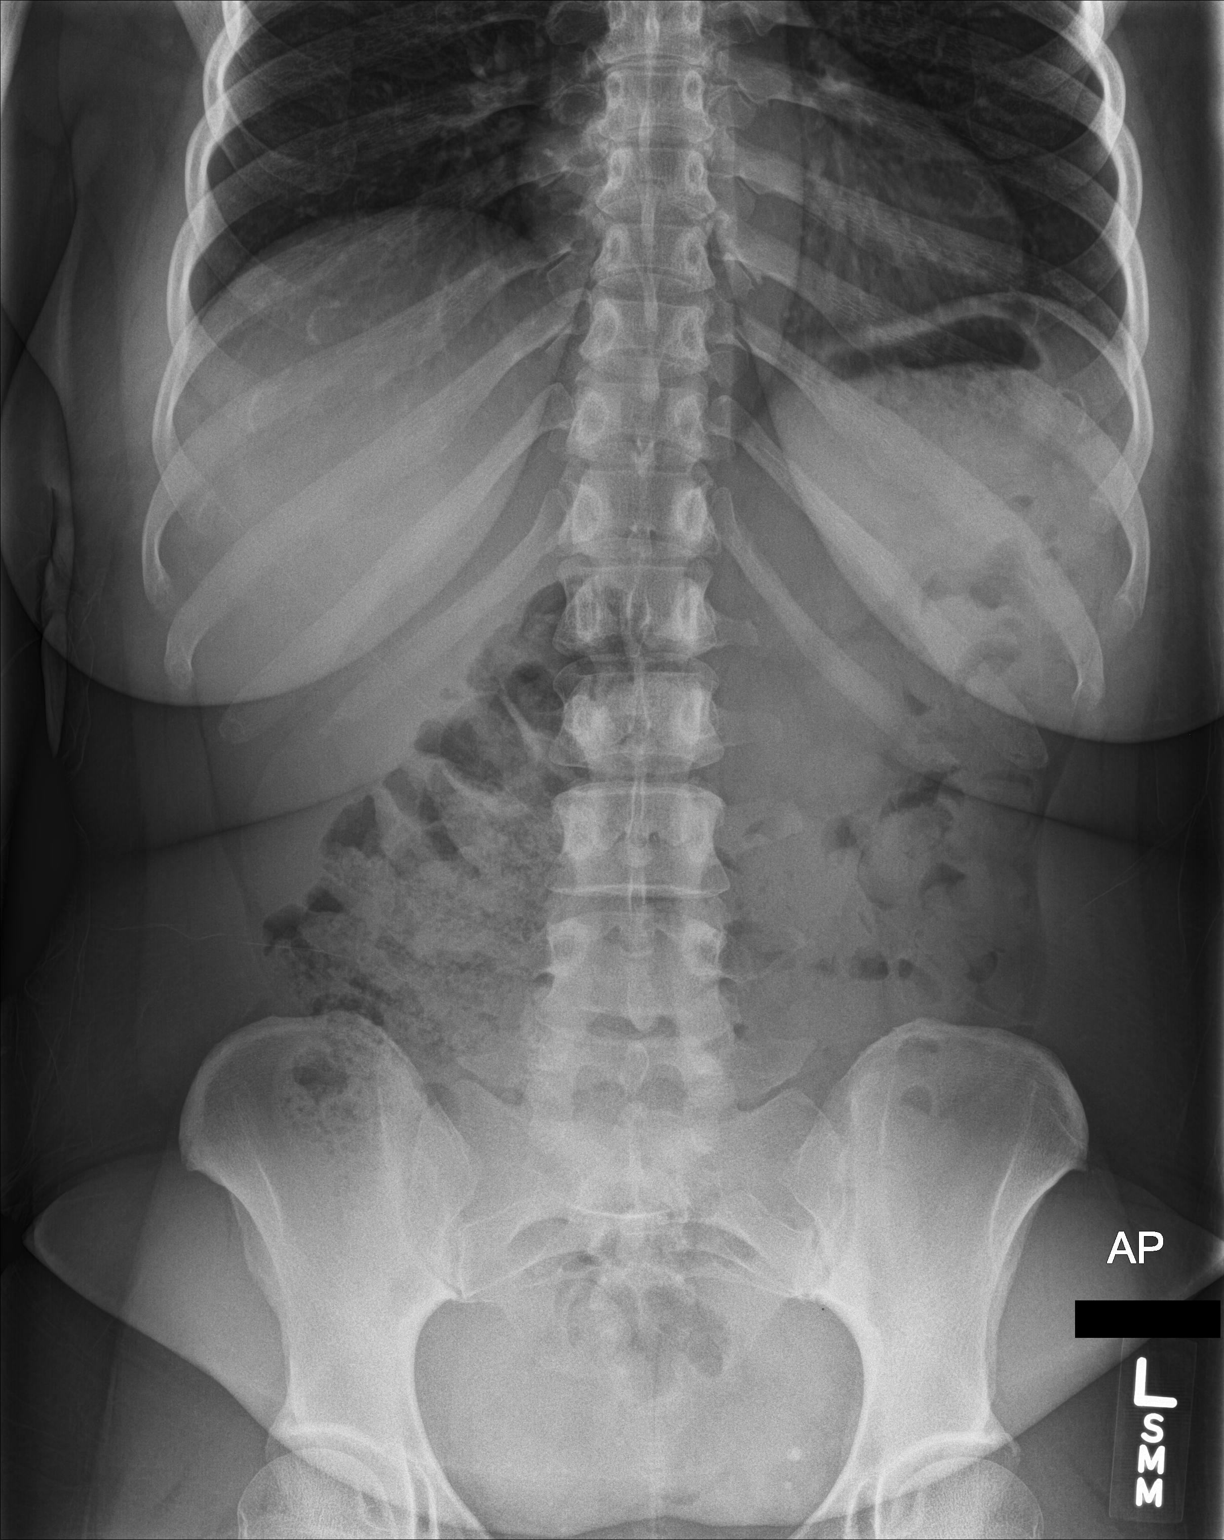

[abdomen supine]
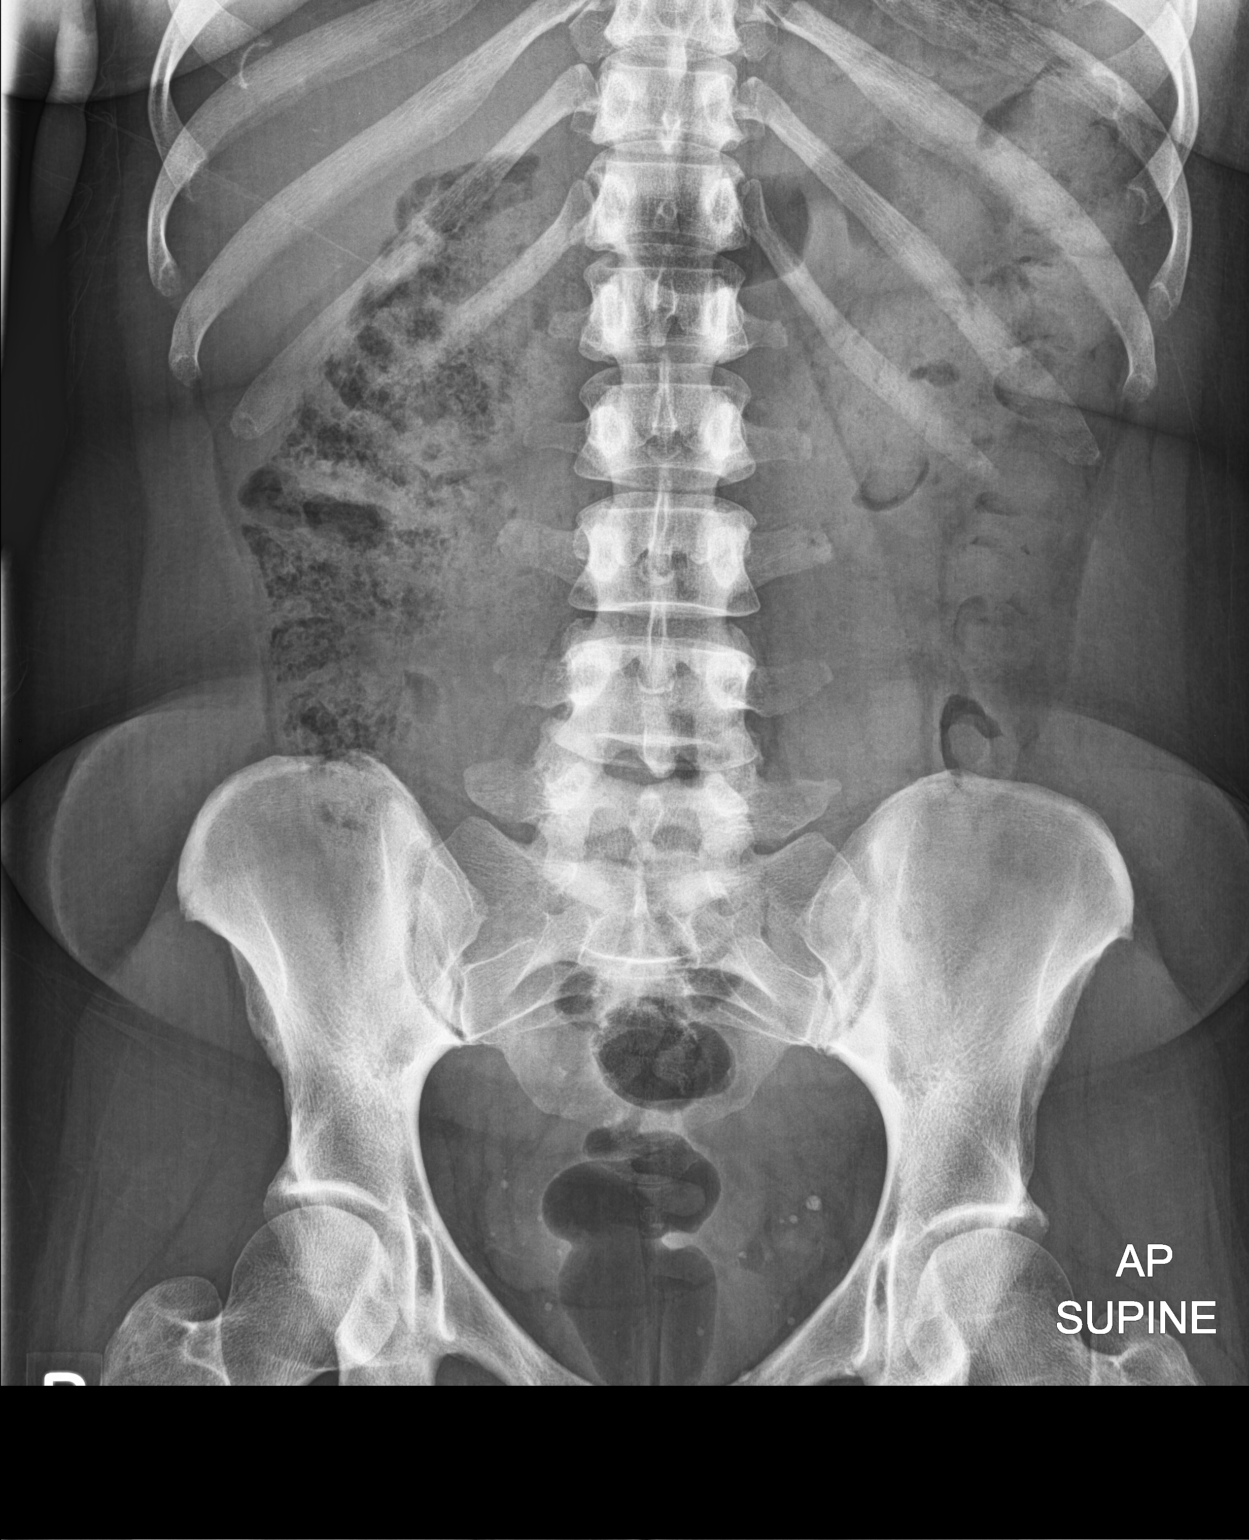

[2 of 2 positions shown; findings below may reference images not displayed]

FINDINGS: No evidence of dilated small bowel loops or air-fluid levels. No
evidence of free intraperitoneal air. Moderate stool seen within the
ascending, transverse and descending portions of the colon. Gas is
noted within the rectum. Colon is nondilated. Multiple pelvic
phleboliths are noted, but no definite radiopaque calculi are
identified.
IMPRESSION: No acute findings.  Moderate colonic stool burden noted.

## 2018-03-20 ENCOUNTER — Encounter: Payer: Self-pay | Admitting: Emergency Medicine

## 2018-03-20 ENCOUNTER — Ambulatory Visit
Admission: EM | Admit: 2018-03-20 | Discharge: 2018-03-20 | Disposition: A | Discharge status: Home / Self Care | Payer: Commercial Managed Care - PPO | Attending: Family Medicine | Admitting: Family Medicine

## 2018-03-20 DIAGNOSIS — J45909 Unspecified asthma, uncomplicated: Secondary | ICD-10-CM | POA: Insufficient documentation

## 2018-03-20 DIAGNOSIS — Z79899 Other long term (current) drug therapy: Secondary | ICD-10-CM | POA: Insufficient documentation

## 2018-03-20 DIAGNOSIS — Z202 Contact with and (suspected) exposure to infections with a predominantly sexual mode of transmission: Secondary | ICD-10-CM

## 2018-03-20 DIAGNOSIS — F17290 Nicotine dependence, other tobacco product, uncomplicated: Secondary | ICD-10-CM | POA: Diagnosis not present

## 2018-03-20 DIAGNOSIS — Z791 Long term (current) use of non-steroidal anti-inflammatories (NSAID): Secondary | ICD-10-CM | POA: Diagnosis not present

## 2018-03-20 DIAGNOSIS — R1030 Lower abdominal pain, unspecified: Secondary | ICD-10-CM | POA: Diagnosis present

## 2018-03-20 DIAGNOSIS — N898 Other specified noninflammatory disorders of vagina: Secondary | ICD-10-CM | POA: Diagnosis not present

## 2018-03-20 LAB — POCT URINALYSIS DIP (MANUAL ENTRY)
GLUCOSE UA: NEGATIVE mg/dL
Ketones, POC UA: NEGATIVE mg/dL
Nitrite, UA: NEGATIVE
Spec Grav, UA: 1.025 (ref 1.010–1.025)
Urobilinogen, UA: 4 E.U./dL — AB
pH, UA: 7 (ref 5.0–8.0)

## 2018-03-20 NOTE — ED Triage Notes (Signed)
Pt presents to [facility] for assessment of STDs due to distrusting her current partner.  C/o some lower bilateral abdominal pain, and some groin pain.  C/o milky discharge for 2 weeks.

## 2018-03-20 NOTE — ED Notes (Signed)
Patient able to ambulate independently  
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
## 2018-03-20 NOTE — Discharge Instructions (Signed)
We have done laboratory testing today for sexually transmitted diseases.  We will testing for gonorrhea, chlamydia, trichomonas, syphilis, and HIV. We did lab testing during this visit.  If there are any abnormal findings that require change in medicine or indicate a positive result, you will be notified.  If all of your tests are normal, you will not be called.

## 2018-03-23 LAB — CERVICOVAGINAL ANCILLARY ONLY
Chlamydia: NEGATIVE
NEISSERIA GONORRHEA: NEGATIVE
TRICH (WINDOWPATH): NEGATIVE

## 2018-03-24 LAB — URINE CULTURE

## 2018-03-31 ENCOUNTER — Telehealth (HOSPITAL_COMMUNITY): Payer: Self-pay | Admitting: Emergency Medicine

## 2018-03-31 MED ORDER — NITROFURANTOIN MONOHYD MACRO 100 MG PO CAPS: 100.0000 mg | ORAL_CAPSULE | Freq: Two times a day (BID) | ORAL | 0 refills | Status: AC

## 2018-03-31 NOTE — Telephone Encounter (Signed)
Urine culture was positive ENTEROCOCCUS FAECALIS, untreated,  Prescription for macrobid per protocol sent to pharmacy of choice. Pt called and made aware. Pt educated to follow up if symptoms are not improving. Verbalized understanding.

## 2018-12-19 ENCOUNTER — Encounter: Payer: Self-pay | Admitting: Gynecology

## 2019-08-03 ENCOUNTER — Emergency Department (HOSPITAL_COMMUNITY)
Admission: EM | Admit: 2019-08-03 | Discharge: 2019-08-03 | Disposition: A | Discharge status: Home / Self Care | Payer: Commercial Managed Care - PPO | Attending: Emergency Medicine | Admitting: Emergency Medicine

## 2019-08-03 ENCOUNTER — Other Ambulatory Visit: Payer: Self-pay

## 2019-08-03 ENCOUNTER — Encounter (HOSPITAL_COMMUNITY): Payer: Self-pay

## 2019-08-03 DIAGNOSIS — Z91010 Allergy to peanuts: Secondary | ICD-10-CM | POA: Diagnosis not present

## 2019-08-03 DIAGNOSIS — R110 Nausea: Secondary | ICD-10-CM | POA: Insufficient documentation

## 2019-08-03 DIAGNOSIS — Z79899 Other long term (current) drug therapy: Secondary | ICD-10-CM | POA: Diagnosis not present

## 2019-08-03 DIAGNOSIS — J3489 Other specified disorders of nose and nasal sinuses: Secondary | ICD-10-CM | POA: Diagnosis not present

## 2019-08-03 DIAGNOSIS — M791 Myalgia, unspecified site: Secondary | ICD-10-CM | POA: Diagnosis not present

## 2019-08-03 DIAGNOSIS — R0981 Nasal congestion: Secondary | ICD-10-CM | POA: Insufficient documentation

## 2019-08-03 DIAGNOSIS — J45909 Unspecified asthma, uncomplicated: Secondary | ICD-10-CM | POA: Insufficient documentation

## 2019-08-03 DIAGNOSIS — R000 Tachycardia, unspecified: Secondary | ICD-10-CM | POA: Insufficient documentation

## 2019-08-03 DIAGNOSIS — I5020 Unspecified systolic (congestive) heart failure: Secondary | ICD-10-CM | POA: Insufficient documentation

## 2019-08-03 DIAGNOSIS — Z20822 Contact with and (suspected) exposure to covid-19: Secondary | ICD-10-CM | POA: Diagnosis not present

## 2019-08-03 DIAGNOSIS — R430 Anosmia: Secondary | ICD-10-CM | POA: Diagnosis not present

## 2019-08-03 DIAGNOSIS — R630 Anorexia: Secondary | ICD-10-CM | POA: Diagnosis not present

## 2019-08-03 DIAGNOSIS — F17290 Nicotine dependence, other tobacco product, uncomplicated: Secondary | ICD-10-CM | POA: Insufficient documentation

## 2019-08-03 DIAGNOSIS — R109 Unspecified abdominal pain: Secondary | ICD-10-CM | POA: Insufficient documentation

## 2019-08-03 LAB — CBC WITH DIFFERENTIAL/PLATELET
Abs Immature Granulocytes: 0.01 10*3/uL (ref 0.00–0.07)
Basophils Absolute: 0 10*3/uL (ref 0.0–0.1)
Basophils Relative: 1 %
Eosinophils Absolute: 0 10*3/uL (ref 0.0–0.5)
Eosinophils Relative: 0 %
HCT: 47.8 % — ABNORMAL HIGH (ref 36.0–46.0)
Hemoglobin: 16.6 g/dL — ABNORMAL HIGH (ref 12.0–15.0)
Immature Granulocytes: 0 %
Lymphocytes Relative: 57 %
Lymphs Abs: 2.7 10*3/uL (ref 0.7–4.0)
MCH: 29 pg (ref 26.0–34.0)
MCHC: 34.7 g/dL (ref 30.0–36.0)
MCV: 83.4 fL (ref 80.0–100.0)
Monocytes Absolute: 0.7 10*3/uL (ref 0.1–1.0)
Monocytes Relative: 14 %
Neutro Abs: 1.4 10*3/uL — ABNORMAL LOW (ref 1.7–7.7)
Neutrophils Relative %: 28 %
Platelets: 195 10*3/uL (ref 150–400)
RBC: 5.73 MIL/uL — ABNORMAL HIGH (ref 3.87–5.11)
RDW: 13.3 % (ref 11.5–15.5)
WBC: 4.8 10*3/uL (ref 4.0–10.5)
nRBC: 0 % (ref 0.0–0.2)

## 2019-08-03 LAB — COMPREHENSIVE METABOLIC PANEL
ALT: 17 U/L (ref 0–44)
AST: 22 U/L (ref 15–41)
Albumin: 4.1 g/dL (ref 3.5–5.0)
Alkaline Phosphatase: 51 U/L (ref 38–126)
Anion gap: 9 (ref 5–15)
BUN: 15 mg/dL (ref 6–20)
CO2: 26 mmol/L (ref 22–32)
Calcium: 8.6 mg/dL — ABNORMAL LOW (ref 8.9–10.3)
Chloride: 107 mmol/L (ref 98–111)
Creatinine, Ser: 0.91 mg/dL (ref 0.44–1.00)
GFR calc Af Amer: >60 mL/min (ref >60)
GFR calc non Af Amer: >60 mL/min (ref >60)
Glucose, Bld: 97 mg/dL (ref 70–99)
Potassium: 3.9 mmol/L (ref 3.5–5.1)
Sodium: 142 mmol/L (ref 135–145)
Total Bilirubin: 0.6 mg/dL (ref 0.3–1.2)
Total Protein: 7.7 g/dL (ref 6.5–8.1)

## 2019-08-03 LAB — I-STAT BETA HCG BLOOD, ED (MC, WL, AP ONLY): I-stat hCG, quantitative: <5 m[IU]/mL (ref <5)

## 2019-08-03 LAB — CK: Total CK: 89 U/L (ref 38–234)

## 2019-08-03 MED ORDER — SODIUM CHLORIDE 0.9 % IV BOLUS
1000.0000 mL | Freq: Once | INTRAVENOUS | Status: AC
  Administered 2019-08-03: 18:00:00 1000 mL via INTRAVENOUS

## 2019-08-03 MED ORDER — KETOROLAC TROMETHAMINE 30 MG/ML IJ SOLN
30.0000 mg | Freq: Once | INTRAMUSCULAR | Status: AC
  Administered 2019-08-03: 30 mg via INTRAVENOUS
  Filled 2019-08-03: qty 1

## 2019-08-03 NOTE — ED Notes (Signed)
Patient refuses covid test  

## 2019-08-03 NOTE — ED Triage Notes (Signed)
Patient c/o bilateral thigh pain x 3 days. Patient states she lost her sense of smell 2 days ago.

## 2019-08-03 NOTE — Discharge Instructions (Signed)
Your lab work today was reassuring, symptoms may be related to Covid infection, follow-up on results from your primary care doctor for your Covid test.  Make sure you are drinking plenty of water, take ibuprofen and Tylenol to help with body aches.  If you develop chest pain, worsening shortness of breath or other new or concerning symptoms you can return to the ED.

## 2019-08-03 NOTE — ED Provider Notes (Signed)
[facility] DEPT Provider Note   CSN: [id number] Arrival date & time: 08/03/19  1254     History Chief Complaint  Patient presents with  . Leg Pain  . loss of smell.    Sarah Beltran is a 49 y.o. female.  Sarah Beltran is a 49 y.o. female history of asthma, anemia, uterine fibroids, who presents to the ED for evaluation of body aches and loss of smell.  She states that for the past 3-4 days she has noted body aches that are most severe in bilateral thighs, she denies any swelling or color change in the legs and reports she also feels achy in her arms and back.  She states that body aches started to bother so much that she had to leave work on Tuesday.  Thursday she noticed that she had lost her sense of smell.  Reports that she is still able to taste but reports she has no appetite.  Reports that with the symptoms she is also had some abdominal cramping and discomfort some nausea but no vomiting.  No diarrhea or constipation.  Abdominal cramping has actually improved over the past 2 days.  Reports that she has had some rhinorrhea and nasal congestion but this has been going on for longer and she attributes this to her allergies.  Denies any associated cough, chest pain or shortness of breath.  No fevers or chills.  Had a telemedicine visit with her doctor yesterday who recommended a Covid test, and if this came back negative they were going to schedule an in person visit.  She states that she had a Covid test done but has not received the results of this yet, and is feeling worse today.  She has not taken anything to treat her symptoms prior to arrival.  No other aggravating or alleviating factors.        Past Medical History:  Diagnosis Date  . Abnormal Pap smear   . Anemia   . Asthma   . Fibroid, uterine     Patient Active Problem List   Diagnosis Date Noted  . TOBACCO USER 03/31/2009  . GANGLION CYST 03/27/2009  . ALLERGIC RHINITIS CAUSE  UNSPECIFIED 03/14/2009  . OBESITY, UNSPECIFIED 03/05/2009  . VAGINAL DISCHARGE 09/02/2008  . DEPRESSION 08/01/2008  . SYSTOLIC HEART FAILURE, ACUTE ON CHRONIC 02/20/2008  . FATIGUE / MALAISE 02/20/2008  . NUMBNESS 02/20/2008  . TACHYCARDIA 02/20/2008  . PALPITATIONS 02/20/2008  . DYSPNEA 02/20/2008  . ABNORMAL ECHOCARDIOGRAM 02/20/2008  . NONSPECIFIC LOW BP 02/20/2008    Past Surgical History:  Procedure Laterality Date  . CESAREAN SECTION    . TUBAL LIGATION       OB History    Gravida  5   Para  3   Term  3   Preterm      AB  2   Living  3     SAB  2   TAB      Ectopic      Multiple      Live Births  3           Family History  Problem Relation Age of Onset  . Hypertension Mother   . Thyroid disease Mother   . Diabetes Father   . Hypertension Father   . Thyroid disease Sister     Social History   Tobacco Use  . Smoking status: Current Every Day Smoker    Packs/day: 0.00    Types: E-cigarettes  .  Smokeless tobacco: Never Used  Substance Use Topics  . Alcohol use: Yes    Comment: rarely  . Drug use: Not Currently    Types: Marijuana    Home Medications Prior to Admission medications   Medication Sig Start Date End Date Taking? Authorizing Provider  albuterol (PROVENTIL HFA;VENTOLIN HFA) 108 (90 Base) MCG/ACT inhaler Inhale 2 puffs into the lungs every 6 (six) hours as needed for shortness of breath.  06/17/16   [provider]  EPINEPHrine 0.3 mg/0.3 mL IJ SOAJ injection Inject 0.3 mg into the muscle once.  06/26/13   [provider]  fluticasone (FLONASE) 50 MCG/ACT nasal spray Place 1 spray into both nostrils daily as needed for allergies or rhinitis.  06/07/16 06/07/17  [provider]  ibuprofen (ADVIL,MOTRIN) 200 MG tablet Take 400 mg by mouth every 6 (six) hours as needed for mild pain or cramping.    [provider]  nitrofurantoin, macrocrystal-monohydrate, (MACROBID) 100 MG capsule Take 1 capsule  (100 mg total) by mouth 2 (two) times daily. 03/31/18   Raylene Everts, MD    Allergies    Peanut-containing drug products and Peanut oil  Review of Systems   Review of Systems  Constitutional: Negative for chills and fever.  HENT: Positive for congestion and rhinorrhea. Negative for sore throat.   Respiratory: Negative for cough and shortness of breath.   Cardiovascular: Negative for chest pain.  Gastrointestinal: Positive for abdominal pain and nausea. Negative for diarrhea and vomiting.  Genitourinary: Negative for dysuria and frequency.  Musculoskeletal: Positive for back pain and myalgias. Negative for arthralgias, neck pain and neck stiffness.  Skin: Negative for color change and rash.  Neurological: Negative for dizziness, syncope and light-headedness.    Physical Exam Updated Vital Signs BP 112/78 (BP Location: Left Arm)   Pulse (!) 109   Temp 98.3 F (36.8 C) (Oral)   Resp 16   Ht 5' 3.5" (1.613 m)   Wt 82.1 kg   LMP 07/27/2019 (Approximate)   SpO2 95%   BMI 31.56 kg/m   Physical Exam Vitals and nursing note reviewed.  Constitutional:      General: She is not in acute distress.    Appearance: Normal appearance. She is well-developed. She is not ill-appearing or diaphoretic.     Comments: Well-appearing and in no distress  HENT:     Head: Normocephalic and atraumatic.     Mouth/Throat:     Mouth: Mucous membranes are moist.     Pharynx: Oropharynx is clear.  Eyes:     General:        Right eye: No discharge.        Left eye: No discharge.  Cardiovascular:     Rate and Rhythm: Normal rate and regular rhythm.     Heart sounds: Normal heart sounds. No murmur. No friction rub. No gallop.   Pulmonary:     Effort: Pulmonary effort is normal. No respiratory distress.     Breath sounds: Normal breath sounds. No wheezing or rales.     Comments: Respirations equal and unlabored, patient able to speak in full sentences, lungs clear to auscultation  bilaterally Abdominal:     General: Bowel sounds are normal. There is no distension.     Palpations: Abdomen is soft. There is no mass.     Tenderness: There is no abdominal tenderness. There is no guarding.     Comments: Abdomen soft, nondistended, nontender to palpation in all quadrants without guarding or peritoneal signs  Musculoskeletal:        General: Tenderness present. No deformity.     Cervical back: Neck supple.     Comments: Mild tenderness over the arms and legs without noted swelling, rash or skin changes, able to move all extremities without difficulty, all compartments soft  Skin:    General: Skin is warm and dry.     Capillary Refill: Capillary refill takes less than 2 seconds.  Neurological:     Mental Status: She is alert.     Coordination: Coordination normal.     Comments: Speech is clear, able to follow commands Moves extremities without ataxia, coordination intact  Psychiatric:        Mood and Affect: Mood normal.        Behavior: Behavior normal.     ED Results / Procedures / Treatments   Labs (all labs ordered are listed, but only abnormal results are displayed) Labs Reviewed  COMPREHENSIVE METABOLIC PANEL - Abnormal; Notable for the following components:      Result Value   Calcium 8.6 (*)    All other components within normal limits  CBC WITH DIFFERENTIAL/PLATELET - Abnormal; Notable for the following components:   RBC 5.73 (*)    Hemoglobin 16.6 (*)    HCT 47.8 (*)    Neutro Abs 1.4 (*)    All other components within normal limits  CK  URINALYSIS, ROUTINE W REFLEX MICROSCOPIC  I-STAT BETA HCG BLOOD, ED ([facility], [facility], [facility] ONLY)  POC SARS CORONAVIRUS 2 AG -  ED    EKG None  Radiology No results found.  Procedures Procedures (including critical care time)  Medications Ordered in ED Medications  sodium chloride 0.9 % bolus 1,000 mL (1,000 mLs Intravenous New Bag/Given 08/03/19 1740)  ketorolac (TORADOL) 30 MG/ML injection 30 mg (30 mg Intravenous  Given 08/03/19 1833)    ED Course  I have reviewed the triage vital signs and the nursing notes.  Pertinent labs & imaging results that were available during my care of the patient were reviewed by me and considered in my medical decision making (see chart for details).    MDM Rules/Calculators/A&P                      49 year old female presents with 4 days of leg pain and body aches, and 2 days of loss of smell.  Had virtual visit with her PCP, and they expressed concern for Covid, she has not yet had her vaccine.  Had swab done yesterday but has not yet received results.  She is also had some abdominal cramping, nausea and decreased appetite.  Reports she just feels overall malaise.  Has not had any fevers. No respiratory symptoms.  Patient does not want to do rapid Covid test today, but we will also check basic labs given myalgias we will also check a CK.  We will give a liter of IV fluids.  Mild tachycardia on arrival but all other vitals stable patient without chest pain or shortness of breath.  I have personally ordered, reviewed and interpreted all lab work. CBC: No leukocytosis, slightly elevated hemoglobin, likely mild hemoconcentration CMP: Mild hypocalcemia of 8.6, no other electrolyte derangements, normal renal and liver function CK: WNL Preg test: Negative  Work-up today has been very reassuring, tachycardia resolved with fluids.  I have high suspicion for Covid infection, but fortunately patient's symptoms seem to be mild.  Myalgias improved with Toradol.  Will have patient follow-up with her PCP regarding  Covid test results.  If negative, she should see her PCP for further evaluation of symptoms or not improving.  Return precautions discussed.  Patient expresses understanding and agreement.  Discharged home in good condition.  Final Clinical Impression(s) / ED Diagnoses Final diagnoses:  Myalgia  Loss of smell  Suspected COVID-19 virus infection    Rx / DC Orders ED  Discharge Orders    None       Jacqlyn Larsen, [place] 08/03/19 1941    Daleen Bo, MD 08/03/19 2308

## 2024-01-28 ENCOUNTER — Encounter (HOSPITAL_COMMUNITY): Payer: Self-pay

## 2024-01-28 ENCOUNTER — Other Ambulatory Visit: Payer: Self-pay

## 2024-01-28 ENCOUNTER — Emergency Department (HOSPITAL_COMMUNITY): Admission: EM | Admit: 2024-01-28 | Discharge: 2024-01-28 | Disposition: A | Discharge status: Home / Self Care

## 2024-01-28 ENCOUNTER — Emergency Department (HOSPITAL_COMMUNITY)

## 2024-01-28 DIAGNOSIS — Z91010 Allergy to peanuts: Secondary | ICD-10-CM | POA: Diagnosis not present

## 2024-01-28 DIAGNOSIS — Y92811 Bus as the place of occurrence of the external cause: Secondary | ICD-10-CM | POA: Diagnosis not present

## 2024-01-28 DIAGNOSIS — Y990 Civilian activity done for income or pay: Secondary | ICD-10-CM | POA: Diagnosis not present

## 2024-01-28 DIAGNOSIS — J45909 Unspecified asthma, uncomplicated: Secondary | ICD-10-CM | POA: Insufficient documentation

## 2024-01-28 DIAGNOSIS — T5991XA Toxic effect of unspecified gases, fumes and vapors, accidental (unintentional), initial encounter: Secondary | ICD-10-CM | POA: Insufficient documentation

## 2024-01-28 DIAGNOSIS — Z7951 Long term (current) use of inhaled steroids: Secondary | ICD-10-CM | POA: Insufficient documentation

## 2024-01-28 NOTE — ED Notes (Signed)
 X-ray at bedside.

## 2024-01-28 NOTE — Discharge Instructions (Addendum)
 Your x-ray did not show any concerning findings and your oxygen levels here were normal.  There is no specific treatment for the inhalation.  You should not have any issues with this.  Please follow-up with your primary care provider as needed.
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
